# Patient Record
Sex: Female | Born: 1950 | Race: White | Hispanic: No | Marital: Married | Smoking: Never smoker
Health system: Southern US, Community
[De-identification: ages and names within clinical notes are randomized; demographics above are authoritative.]

## PROBLEM LIST (undated history)

## (undated) DIAGNOSIS — K602 Anal fissure, unspecified: Secondary | ICD-10-CM

## (undated) DIAGNOSIS — N809 Endometriosis, unspecified: Secondary | ICD-10-CM

## (undated) DIAGNOSIS — N904 Leukoplakia of vulva: Secondary | ICD-10-CM

## (undated) DIAGNOSIS — E785 Hyperlipidemia, unspecified: Secondary | ICD-10-CM

## (undated) HISTORY — DX: Hyperlipidemia, unspecified: E78.5

## (undated) HISTORY — DX: Anal fissure, unspecified: K60.2

## (undated) HISTORY — DX: Endometriosis, unspecified: N80.9

---
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
## 1996-09-05 DIAGNOSIS — K602 Anal fissure, unspecified: Secondary | ICD-10-CM

## 1996-09-05 HISTORY — DX: Anal fissure, unspecified: K60.2

## 1998-10-06 HISTORY — PX: ABDOMINAL HYSTERECTOMY: SHX81

## 1999-09-09 ENCOUNTER — Other Ambulatory Visit: Admission: RE | Admit: 1999-09-09 | Discharge: 1999-09-09 | Payer: Self-pay | Admitting: *Deleted

## 1999-09-18 ENCOUNTER — Encounter: Admission: RE | Admit: 1999-09-18 | Discharge: 1999-09-18 | Payer: Self-pay | Admitting: Gastroenterology

## 1999-09-18 ENCOUNTER — Encounter: Payer: Self-pay | Admitting: Gastroenterology

## 2000-06-10 ENCOUNTER — Encounter: Payer: Self-pay | Admitting: *Deleted

## 2000-06-10 ENCOUNTER — Ambulatory Visit (HOSPITAL_COMMUNITY): Admission: RE | Admit: 2000-06-10 | Discharge: 2000-06-10 | Payer: Self-pay | Admitting: *Deleted

## 2000-09-23 ENCOUNTER — Other Ambulatory Visit: Admission: RE | Admit: 2000-09-23 | Discharge: 2000-09-23 | Payer: Self-pay | Admitting: *Deleted

## 2000-10-26 ENCOUNTER — Inpatient Hospital Stay (HOSPITAL_COMMUNITY): Admission: AD | Admit: 2000-10-26 | Discharge: 2000-10-26 | Payer: Self-pay | Admitting: Obstetrics and Gynecology

## 2000-10-28 ENCOUNTER — Encounter: Payer: Self-pay | Admitting: Obstetrics and Gynecology

## 2000-10-28 ENCOUNTER — Ambulatory Visit (HOSPITAL_COMMUNITY): Admission: RE | Admit: 2000-10-28 | Discharge: 2000-10-28 | Payer: Self-pay | Admitting: Obstetrics and Gynecology

## 2000-11-03 ENCOUNTER — Encounter (INDEPENDENT_AMBULATORY_CARE_PROVIDER_SITE_OTHER): Payer: Self-pay

## 2000-11-03 ENCOUNTER — Inpatient Hospital Stay (HOSPITAL_COMMUNITY): Admission: EM | Admit: 2000-11-03 | Discharge: 2000-11-05 | Payer: Self-pay | Admitting: Obstetrics and Gynecology

## 2000-11-03 ENCOUNTER — Encounter: Payer: Self-pay | Admitting: Obstetrics and Gynecology

## 2003-03-01 ENCOUNTER — Encounter: Payer: Self-pay | Admitting: *Deleted

## 2003-03-01 ENCOUNTER — Encounter: Admission: RE | Admit: 2003-03-01 | Discharge: 2003-03-01 | Payer: Self-pay | Admitting: *Deleted

## 2003-09-04 ENCOUNTER — Other Ambulatory Visit: Admission: RE | Admit: 2003-09-04 | Discharge: 2003-09-04 | Payer: Self-pay | Admitting: *Deleted

## 2005-09-15 ENCOUNTER — Other Ambulatory Visit: Admission: RE | Admit: 2005-09-15 | Discharge: 2005-09-15 | Payer: Self-pay | Admitting: Obstetrics and Gynecology

## 2006-07-01 ENCOUNTER — Encounter: Admission: RE | Admit: 2006-07-01 | Discharge: 2006-07-01 | Payer: Self-pay | Admitting: *Deleted

## 2007-09-20 ENCOUNTER — Other Ambulatory Visit: Admission: RE | Admit: 2007-09-20 | Discharge: 2007-09-20 | Payer: Self-pay | Admitting: Obstetrics and Gynecology

## 2007-10-26 ENCOUNTER — Encounter: Admission: RE | Admit: 2007-10-26 | Discharge: 2007-10-26 | Payer: Self-pay | Admitting: Gastroenterology

## 2008-09-21 ENCOUNTER — Other Ambulatory Visit: Admission: RE | Admit: 2008-09-21 | Discharge: 2008-09-21 | Payer: Self-pay | Admitting: Obstetrics and Gynecology

## 2008-11-29 ENCOUNTER — Ambulatory Visit: Admission: RE | Admit: 2008-11-29 | Discharge: 2008-11-29 | Payer: Self-pay | Admitting: Gynecologic Oncology

## 2010-07-23 ENCOUNTER — Emergency Department (HOSPITAL_COMMUNITY): Admission: EM | Admit: 2010-07-23 | Discharge: 2010-07-23 | Payer: Self-pay | Admitting: Emergency Medicine

## 2010-08-20 ENCOUNTER — Encounter: Admission: RE | Admit: 2010-08-20 | Discharge: 2010-08-20 | Payer: Self-pay | Admitting: Orthopedic Surgery

## 2010-10-28 ENCOUNTER — Encounter: Payer: Self-pay | Admitting: Obstetrics & Gynecology

## 2011-02-18 NOTE — Consult Note (Signed)
Christy Rojas, Christy Rojas             ACCOUNT NO.:  [id number]   MEDICAL RECORD NO.:  [id number]          PATIENT TYPE:  OUT   LOCATION:  GYN                          FACILITY:  [facility]   PHYSICIAN:  John T. Kyla Balzarine, M.D.    DATE OF BIRTH:  1951/04/15   DATE OF CONSULTATION:  11/29/2008  DATE OF DISCHARGE:                                 CONSULTATION   CHIEF COMPLAINT:  Vaginal bleeding with likely persistent endometriosis.   HISTORY OF PRESENT ILLNESS:  This patient is being seen by me at [facility].  She underwent TAH/BSO for endometriosis in 2003.  She reported  postcoital spotting and an ultrasound revealed a small mass to the  vaginal cuff.  CA-125 value was 11.9 and Pap smear was normal.  At  referral, she had thickening at the cuff.  Of note, during surgery, she  had extensive endometriosis and during anesthetic induction developed  hypotension and tachycardia thought related to LATEX allergy.  At [facility]  last autumn she had a CT of abdomen and pelvis and repeat vaginal  ultrasound; neither revealed evidence of a mass.  Over the past several  weeks, the patient has noted vaginal bleeding and spotting on a daily  basis which ceased this weekend.  She denies new pain but continues to  have some dyspareunia.  Should be noted that she had granulation tissue  at the upper vagina biopsied on January 4 that was compatible with  granulation tissue without evidence of endometriosis or atypia.   MEDICATIONS:  Nasonex, Singulair, Advair, Zyrtec, Nexium, fish oil,  triple flexor vitamins, Centrum, Zantac, Alka-Seltzer, Celebrex, vitamin  D, red yeast rice, Zegerid and Proctosol p.r.n.   ALLERGIES:  LATEX and SEASONAL ALLERGIES.   PERSONAL AND SOCIAL HISTORY:  Married and physically active; denies  tobacco use and admits occasional ethanol.   FAMILY HISTORY:  Mother with breast cancer but no GYN or colon cancers.   REVIEW OF SYSTEMS:  Otherwise 10-point system review negative.   EXAM:  VITAL SIGNS:   Stable and afebrile with weight 168 pounds.  CONSTITUTIONAL:  The patient is anxious, alert and oriented x3 in no  acute distress.  PELVIC:  External genitalia are clear.  Speculum examination reveals  normal BUS, bladder and urethra and vagina with the exception of a 1 mm  tag of granulation tissue at the vaginal apex.  This is cauterized with  silver nitrate.  On bimanual and rectovaginal examinations, a 3 x 4 cm  relatively flat area of minimally tender induration is present adjacent  to the left vaginal fornix.   ASSESSMENT:  Postsurgical pelvic scarring, suspect residual  endometriosis.   PLAN:  Options for immediate surgery and antiestrogen therapy were again  discussed with the patient.  She is not enthusiastic about pursuing  either of these options at present.  She has her followup appointment to  see me in  approximately 3 months.  If she has persistent bleeding or increasing  pain, surgery would obviously be recommended.  Because of her extensive  adhesions previously and the location of this lesion, I think it would  be best  to approach her through an open technique.      John T. Kyla Balzarine, M.D.  Electronically Signed     JTS/MEDQ  D:  11/29/2008  T:  11/29/2008  Job:  [id number]   cc:   Telford Nab, R.N.  [address]  [place], [place] [address]   M. Leda Quail, MD  Fax: [phone]

## 2011-02-21 NOTE — Discharge Summary (Signed)
[facility]  Patient:    Christy Rojas, Christy Rojas                       MRN: [id number] Adm. Date:  [id number] Disc. Date: [id number] Attending:  Jenean Lindau                           Discharge Summary  PRINCIPAL DIAGNOSIS ON DISCHARGE:  Severe endometriosis with left endometrioma and severe pelvic adhesions.  PROCEDURE:  Diagnostic laparoscopy followed by total abdominal hysterectomy with bilateral salpingo-oophorectomy, lysis of adhesions, left ureterolysis.  INTRAOPERATIVE COMPLICATIONS:  Patient went into anaphylactic shock intraoperatively which was felt possibly due to a previously unknown latex sensitivity versus Ancef allergy versus a dose of Nimbex which was a neuromuscular relaxer given just prior to her reaction.  HOSPITAL CONSULTATIONS:  None.  CONDITION ON DISCHARGE:  Improved.  HISTORY OF PRESENT ILLNESS:  Atonya Templer Box is a 60 year old with a large complex adnexal mass on the left who was having worsening pain from this cyst. She was felt to possibly have torsion of the cyst with or without endometriosis.  She was admitted for same-day surgery to start with a laparoscopic left salpingo-oophorectomy with right tubal cautery versus laparotomy with TAH-BSO and additional procedures as indicated.  She desired permanent sterilization in any event.  HOSPITAL COURSE:  She was admitted for same day surgery with no reported allergies or latex sensitivities.  She had undergone a mechanical bowel prep preoperatively.  She was taken to the operating room where, after receiving Ancef preoperative antibiotic prophylaxis she was placed under general endotracheal anesthesia and, first, a laparoscopy followed by a laparotomy was performed.  During the course of the dissection of the left adnexa, the surgeons were notified by the nurse anesthetist that the patients blood pressure was low.  The retractor was repositioned and lap packs were repositioned,  and this did not help the situation.  She subsequently became tachycardic and Dr. Rica Mast was immediately contacted.  He immediately made the diagnosis of an anaphylactic-type reaction as the patient was experiencing wheezing in addition to her unstable vital signs.  At this point, it was not clear what the allergic reaction was to.  It was felt it could be due to the Nimbex which is a muscle relaxer which had just been given versus the Ancef versus latex sensitivity although the patient had never reported this.  In any event, as the patient was being stabilized from the anesthesia standpoint, her entire drape and surgical suite was converted into a latex-free environment. The patient was stabilized by Dr. Rica Mast and he allowed the surgeon to proceed with surgery.  She had extensive lysis of adhesions with removal of the left tube and ovary with a large endometrioma.  As per previous discussion with her, the remainder of the hysterectomy was accomplished particularly since Dr. Rica Mast reported that she was stable and it was appropriate to proceed with definitive surgery.  The surgery was completed without further incident and the patient remained stable throughout.  Estimated blood loss was 300 cc and her hemoglobin was stable.  Postoperatively, she did quite well. She maintained excellent urinary output, stable vital signs, and stable SAO2s on nasal cannula oxygen.  She was placed on latex precautions.  The patient was discussed with Dr. Sandrea Hughs, pulmonology, who felt that she did not need to be seen since she was clinically stable but would see her if necessary.  She was watched in the intensive care unit overnight to make sure she remains stable.  On postoperative day #1, she was tolerating p.o. liquids and medications.  She had remained stable overnight.  She had return of bowel sounds, clear lungs, and stable vital signs.  Her hemoglobin was 11.2 with hematocrit of 33.1, and a  normal potassium at 4.1.  She was transferred to the floor on postoperative day #1.  She continued to make rapid progress with the passage of flatus and tolerance of a general diet on postoperative day #2.  Steri-Strips were not used as they have latex in them.  Therefore, her staples were left in and will be removed in the office when she follows up in one week.  Her pathology returned revealing benign ovarian endometriotic cyst and ovarian endometriosis, benign paratubal cyst and tubal endometriosis of the left per frozen section which was confirmed on final.  Uterus revealed benign proliferative endometrium with no obvious abnormalities other than uterine serosal endometriosis, right fallopian tube and ovaries with no focal abnormality noted.  The patient reported some itching the night of postoperative day #1 but no rash or wheezing, and this resolved spontaneously.  She had no other allergic or anaphylactic signs or symptoms throughout her hospital course.  She was told to observe latex precautions.  She is to take Advil, or Aleve, or Vioxx for pain.  She has prescriptions for Vioxx and Phenergan at home.  She was given a prescription for Ambien 10 mg, dispensed #10, one p.o. q.h.s. p.r.n. sleep with one refill, Percocet 5/500, #30, one to two q.4-6h. p.r.n. pain with no refills, and estradiol 1 mg, dispensed #31, one p.o. q.d. with 11 refills.  Dr. Laurette Schimke has been contacted and will see the patient in follow-up to evaluate her for the source of her reaction.  It has been requested that he send copies of his reports to this physician as well as to Dr. Rica Mast of the department of anesthesiology.  The patient was discharged home on postoperative day #2 in improved condition. She was given routine written and verbal instructions and the prescriptions as noted above.  She is to call the office for excessive pain, fever, bleeding, symptoms of DVT, PE, urinary infection, incisional  infection, allergic-type symptoms, or any other concerns. DD:  01/19/01 TD:  01/19/01 Job: [id number]
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
## 2011-08-01 ENCOUNTER — Other Ambulatory Visit (HOSPITAL_COMMUNITY): Payer: Self-pay | Admitting: Diagnostic Neuroimaging

## 2011-08-01 DIAGNOSIS — R209 Unspecified disturbances of skin sensation: Secondary | ICD-10-CM

## 2011-08-01 DIAGNOSIS — R200 Anesthesia of skin: Secondary | ICD-10-CM

## 2011-08-05 ENCOUNTER — Ambulatory Visit (HOSPITAL_COMMUNITY): Payer: BC Managed Care – PPO | Attending: Diagnostic Neuroimaging

## 2011-08-05 DIAGNOSIS — R51 Headache: Secondary | ICD-10-CM | POA: Insufficient documentation

## 2011-08-05 DIAGNOSIS — R209 Unspecified disturbances of skin sensation: Secondary | ICD-10-CM | POA: Insufficient documentation

## 2011-08-05 DIAGNOSIS — R200 Anesthesia of skin: Secondary | ICD-10-CM

## 2011-08-05 DIAGNOSIS — I059 Rheumatic mitral valve disease, unspecified: Secondary | ICD-10-CM

## 2011-08-06 ENCOUNTER — Encounter (HOSPITAL_COMMUNITY): Payer: Self-pay | Admitting: Diagnostic Neuroimaging

## 2011-08-06 ENCOUNTER — Encounter: Payer: Self-pay | Admitting: *Deleted

## 2011-08-22 ENCOUNTER — Emergency Department (HOSPITAL_COMMUNITY): Payer: No Typology Code available for payment source

## 2011-08-22 ENCOUNTER — Encounter: Payer: Self-pay | Admitting: Emergency Medicine

## 2011-08-22 ENCOUNTER — Emergency Department (HOSPITAL_COMMUNITY)
Admission: EM | Admit: 2011-08-22 | Discharge: 2011-08-22 | Disposition: A | Discharge status: Home / Self Care | Payer: No Typology Code available for payment source | Attending: Emergency Medicine | Admitting: Emergency Medicine

## 2011-08-22 DIAGNOSIS — S161XXA Strain of muscle, fascia and tendon at neck level, initial encounter: Secondary | ICD-10-CM

## 2011-08-22 DIAGNOSIS — S139XXA Sprain of joints and ligaments of unspecified parts of neck, initial encounter: Secondary | ICD-10-CM | POA: Insufficient documentation

## 2011-08-22 DIAGNOSIS — M25541 Pain in joints of right hand: Secondary | ICD-10-CM

## 2011-08-22 DIAGNOSIS — M542 Cervicalgia: Secondary | ICD-10-CM | POA: Insufficient documentation

## 2011-08-22 DIAGNOSIS — J45909 Unspecified asthma, uncomplicated: Secondary | ICD-10-CM | POA: Insufficient documentation

## 2011-08-22 DIAGNOSIS — M25549 Pain in joints of unspecified hand: Secondary | ICD-10-CM | POA: Insufficient documentation

## 2011-08-22 MED ORDER — IBUPROFEN 800 MG PO TABS
800.0000 mg | ORAL_TABLET | Freq: Once | ORAL | Status: AC
  Administered 2011-08-22: 800 mg via ORAL
  Filled 2011-08-22: qty 1

## 2011-08-22 MED ORDER — OXYCODONE-ACETAMINOPHEN 5-325 MG PO TABS: 1.0000 | ORAL_TABLET | Freq: Four times a day (QID) | ORAL | Status: AC | PRN

## 2011-08-22 MED ORDER — OXYCODONE-ACETAMINOPHEN 5-325 MG PO TABS
1.0000 | ORAL_TABLET | Freq: Once | ORAL | Status: DC
  Filled 2011-08-22: qty 1
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
## 2013-02-10 ENCOUNTER — Other Ambulatory Visit: Payer: Self-pay | Admitting: Certified Nurse Midwife

## 2013-02-10 NOTE — Telephone Encounter (Signed)
Pt was given this Rx for vitamin D on 10/21/2012 x1 year. Please advise. Chart in your office.

## 2013-02-11 NOTE — Telephone Encounter (Signed)
agree

## 2013-02-14 ENCOUNTER — Encounter: Payer: Self-pay | Admitting: *Deleted

## 2013-02-15 ENCOUNTER — Telehealth: Payer: Self-pay | Admitting: Nurse Practitioner

## 2013-02-15 ENCOUNTER — Encounter: Payer: Self-pay | Admitting: *Deleted

## 2013-02-15 ENCOUNTER — Ambulatory Visit (INDEPENDENT_AMBULATORY_CARE_PROVIDER_SITE_OTHER): Payer: BC Managed Care – PPO | Admitting: Nurse Practitioner

## 2013-02-15 ENCOUNTER — Encounter: Payer: Self-pay | Admitting: Nurse Practitioner

## 2013-02-15 VITALS — BP 124/70 | HR 74 | Resp 12 | Wt 174.4 lb

## 2013-02-15 DIAGNOSIS — B373 Candidiasis of vulva and vagina: Secondary | ICD-10-CM

## 2013-02-15 LAB — POCT WET PREP (WET MOUNT)

## 2013-02-15 MED ORDER — FLUCONAZOLE 150 MG PO TABS: 150.0000 mg | ORAL_TABLET | Freq: Once | ORAL | Status: DC

## 2013-02-15 NOTE — Patient Instructions (Signed)
Monilial Vaginitis Vaginitis in a soreness, swelling and redness (inflammation) of the vagina and vulva. Monilial vaginitis is not a sexually transmitted infection. CAUSES  Yeast vaginitis is caused by yeast (candida) that is normally found in your vagina. With a yeast infection, the candida has overgrown in number to a point that upsets the chemical balance. SYMPTOMS   White, thick vaginal discharge.  Swelling, itching, redness and irritation of the vagina and possibly the lips of the vagina (vulva).  Burning or painful urination.  Painful intercourse. DIAGNOSIS  Things that may contribute to monilial vaginitis are:  Postmenopausal and virginal states.  Pregnancy.  Infections.  Being tired, sick or stressed, especially if you had monilial vaginitis in the past.  Diabetes. Good control will help lower the chance.  Birth control pills.  Tight fitting garments.  Using bubble bath, feminine sprays, douches or deodorant tampons.  Taking certain medications that kill germs (antibiotics).  Sporadic recurrence can occur if you become ill. TREATMENT  Your caregiver will give you medication.  There are several kinds of anti monilial vaginal creams and suppositories specific for monilial vaginitis. For recurrent yeast infections, use a suppository or cream in the vagina 2 times a week, or as directed.  Anti-monilial or steroid cream for the itching or irritation of the vulva may also be used. Get your caregiver's permission.  Painting the vagina with methylene blue solution may help if the monilial cream does not work.  Eating yogurt may help prevent monilial vaginitis. HOME CARE INSTRUCTIONS   Finish all medication as prescribed.  Do not have sex until treatment is completed or after your caregiver tells you it is okay.  Take warm sitz baths.  Do not douche.  Do not use tampons, especially scented ones.  Wear cotton underwear.  Avoid tight pants and panty  hose.  Tell your sexual partner that you have a yeast infection. They should go to their caregiver if they have symptoms such as mild rash or itching.  Your sexual partner should be treated as well if your infection is difficult to eliminate.  Practice safer sex. Use condoms.  Some vaginal medications cause latex condoms to fail. Vaginal medications that harm condoms are:  Cleocin cream.  Butoconazole (Femstat).  Terconazole (Terazol) vaginal suppository.  Miconazole (Monistat) (may be purchased over the counter). SEEK MEDICAL CARE IF:   You have a temperature by mouth above 102 F (38.9 C).  The infection is getting worse after 2 days of treatment.  The infection is not getting better after 3 days of treatment.  You develop blisters in or around your vagina.  You develop vaginal bleeding, and it is not your menstrual period.  You have pain when you urinate.  You develop intestinal problems.  You have pain with sexual intercourse. Document Released: 07/02/2005 Document Revised: 12/15/2011 Document Reviewed: 03/16/2009 ExitCare Patient Information 2013 [employer].  

## 2013-02-15 NOTE — Telephone Encounter (Signed)
Pt states she lost her Rx for vitamin d from 10/2012. Chart and message relayed to front desk staff.

## 2013-02-15 NOTE — Progress Notes (Signed)
Subjective:     Patient ID: Christy Rojas, female   DOB: 12-12-1950, 62 y.o.   MRN: [id number]  Vaginal Pain The patient's primary symptoms include a vaginal discharge. The patient's pertinent negatives include no pelvic pain. This is a new problem. The current episode started in the past 7 days. The problem occurs 2 to 4 times per day. The problem has been waxing and waning. Associated symptoms include painful intercourse. Pertinent negatives include no abdominal pain, diarrhea, discolored urine, fever, flank pain, frequency or urgency. The vaginal discharge was scant and white (External irritaton more than internal irritation.). She has not been passing clots. Exacerbated by: with playing [place] and [place]. She has tried nothing for the symptoms. She is sexually active. She uses hysterectomy for contraception. She is postmenopausal. (History of LSA and tried steroid cream without help.)     Review of Systems  Constitutional: Negative.  Negative for fever.  Respiratory: Negative.   Cardiovascular: Negative.   Gastrointestinal: Negative.  Negative for abdominal pain and diarrhea.  Genitourinary: Positive for vaginal discharge and vaginal pain. Negative for urgency, frequency, flank pain, decreased urine volume and pelvic pain.  Skin: Negative.   Neurological: Negative.   Psychiatric/Behavioral: Negative.        Objective:   Physical Exam  Constitutional: She is oriented to person, place, and time. She appears well-developed and well-nourished.  Cardiovascular: Normal rate and regular rhythm.   Pulmonary/Chest: Effort normal.  Genitourinary: Vaginal discharge found.  Thi white to yellow discharge. External irritation with linear cuts consistent with chronic yeast. No flare of LSA  Neurological: She is alert and oriented to person, place, and time.  Skin: Skin is warm and dry.  Psychiatric: She has a normal mood and affect. Her behavior is normal. Judgment and thought content  normal.       Assessment:     Yeast vulvovaginits History of LSA Atrophic vaginitis uses E.V.V.O. Prn Contraindications with vaginal ERT secondary to [facility] of cancer mother age 24    Plan:     Diflucan 150 mg today and repeat in 3 days.  They are going for a week vacation to [place] this weekend.

## 2013-02-15 NOTE — Telephone Encounter (Signed)
[facility] in [place]. Patient calling re: needs vit d refill authorized please. Patient states it was denied but she is still taking it. Please advise.

## 2013-02-16 NOTE — Telephone Encounter (Signed)
Called pt to notify of lost Rx fee. Pt says she will get over the counter Vit D for now.

## 2013-02-16 NOTE — Progress Notes (Signed)
Encounter reviewed by Dr. Tashina Credit Silva.  

## 2013-03-07 ENCOUNTER — Ambulatory Visit (INDEPENDENT_AMBULATORY_CARE_PROVIDER_SITE_OTHER): Payer: BC Managed Care – PPO | Admitting: Nurse Practitioner

## 2013-03-07 ENCOUNTER — Encounter: Payer: Self-pay | Admitting: Nurse Practitioner

## 2013-03-07 VITALS — BP 118/70 | HR 72 | Resp 16 | Wt 174.6 lb

## 2013-03-07 DIAGNOSIS — B379 Candidiasis, unspecified: Secondary | ICD-10-CM

## 2013-03-07 MED ORDER — FLUCONAZOLE 150 MG PO TABS: 150.0000 mg | ORAL_TABLET | Freq: Once | ORAL | Status: DC

## 2013-03-07 NOTE — Patient Instructions (Addendum)
Use Diflucan 150 mg today and repeat in 3 days.  Keep the other refill for as needed.  A & D with Zinc

## 2013-03-07 NOTE — Progress Notes (Signed)
Subjective:     Patient ID: Christy Rojas, female   DOB: 08-01-1951, 62 y.o.   MRN: [id number]  HPI Patient has as history of yeast vaginitis for which she ws treated with Diflucan.  Symptoms abated but then went to the beach with family and again symptoms flared. Tried OTC Monistat with increasing itching and burning.  External irritation persist. Used OTC A & D and Desitin without help. No other changes in personal products. Denies urinary symptoms and abdominal pain.   Review of Systems  Gastrointestinal: Negative for nausea, vomiting, abdominal pain and diarrhea.  Genitourinary: Positive for vaginal discharge. Negative for dysuria, urgency, frequency, hematuria, flank pain, vaginal bleeding, difficulty urinating and pelvic pain.  Skin: Negative.   Neurological: Negative.   Psychiatric/Behavioral: Negative.        Objective:   Physical Exam  Constitutional: She is oriented to person, place, and time. She appears well-developed and well-nourished.  Abdominal: Soft. She exhibits no distension and no mass. There is no tenderness. There is no rebound and no guarding.  Genitourinary:  External labia with linear cuts consistent with yeast.  White thick vaginal discharge. KOH prep + yeast, Saline negative for clue, Ph 3.5  Neurological: She is alert and oriented to person, place, and time.  Psychiatric: She has a normal mood and affect. Her behavior is normal. Judgment and thought content normal.       Assessment:     Yeast Vulvovaginitis    Plan:    Diflucan  150 MG # 2 Avoid Vagisil and Monistat since they may have aggravated symptoms. She may use A & D ointment with Zinc prn.

## 2013-03-09 NOTE — Progress Notes (Deleted)
You need to also document what you saw with KOH--which I think was yeast but you put wet prep + yeast.  Should put Wet prep:  KOH + Yeast, saline negative for clue, and ph.

## 2013-03-10 NOTE — Progress Notes (Signed)
Reviewed personally.  M. Suzanne Jaquesha Boroff, MD.  

## 2013-05-17 ENCOUNTER — Ambulatory Visit (INDEPENDENT_AMBULATORY_CARE_PROVIDER_SITE_OTHER): Payer: BC Managed Care – PPO | Admitting: Nurse Practitioner

## 2013-05-17 ENCOUNTER — Encounter: Payer: Self-pay | Admitting: Nurse Practitioner

## 2013-05-17 ENCOUNTER — Telehealth: Payer: Self-pay | Admitting: Nurse Practitioner

## 2013-05-17 VITALS — BP 118/58 | HR 74 | Resp 14 | Ht 61.0 in | Wt 171.6 lb

## 2013-05-17 DIAGNOSIS — B373 Candidiasis of vulva and vagina: Secondary | ICD-10-CM

## 2013-05-17 DIAGNOSIS — B3731 Acute candidiasis of vulva and vagina: Secondary | ICD-10-CM

## 2013-05-17 MED ORDER — FLUCONAZOLE 150 MG PO TABS
150.0000 mg | ORAL_TABLET | Freq: Once | ORAL | Status: DC
Start: 2013-05-17 — End: 2013-05-24

## 2013-05-17 NOTE — Progress Notes (Signed)
Subjective:     Patient ID: Christy Rojas, female   DOB: 1951-08-12, 62 y.o.   MRN: [id number]  HPI  62 yo WM Fe presents again with a recurrent yeast infection.  She believes this one is related to use of tub baths to help with her low back pain.  She has external irritation and white vaginal discharge. Denies any urinary symptoms.  She is now using extra virgin olive oil and has improvement in vaginal dryness. She is concerned about chronic yeast and maybe underlying DM.   Review of Systems  Constitutional: Negative for fever and chills.  Respiratory: Negative.   Cardiovascular: Negative.   Gastrointestinal: Negative.   Genitourinary: Positive for vaginal discharge. Negative for dysuria, urgency, frequency, flank pain, vaginal bleeding, vaginal pain and pelvic pain.  Musculoskeletal: Positive for back pain and arthralgias.  Skin: Negative.   Neurological: Negative.   Psychiatric/Behavioral: Negative.        Objective:   Physical Exam  Constitutional: She appears well-developed and well-nourished.  Pulmonary/Chest: Effort normal.  Abdominal: Soft. She exhibits no distension. There is no tenderness. There is no rebound and no guarding.  Genitourinary:  White thin vaginal discharge. No other lesions.  She does have some improvement in vaginal dryness. No pain at bladder. Wet prep: KOH + yeast, NSS negative, PH 5.0       Assessment:     Yeast vaginitis Atrophic vaginitis improved R/O DM as cause of recurrent yeast    Plan:     Diflucan 150 mg X 2 Will get HGB AIC and follow

## 2013-05-17 NOTE — Patient Instructions (Addendum)
We will call you with lab results.       

## 2013-05-18 LAB — HEMOGLOBIN A1C: Hgb A1c MFr Bld: 6 % — ABNORMAL HIGH (ref <5.7)

## 2013-05-19 ENCOUNTER — Telehealth: Payer: Self-pay | Admitting: *Deleted

## 2013-05-19 NOTE — Telephone Encounter (Signed)
LVM to return my call in regards to lab results.  

## 2013-05-19 NOTE — Telephone Encounter (Signed)
Message copied by Osie Bond on Thu May 19, 2013  9:05 AM ------      Message from: Ria Comment R      Created: Thu May 19, 2013  8:12 AM       Let patient know that her HGB AIC is elevated at 6.0 and will need to see PCP. ------

## 2013-05-19 NOTE — Progress Notes (Signed)
Encounter reviewed by Dr. Brook Silva.  

## 2013-05-20 NOTE — Telephone Encounter (Signed)
Patient returning Tiffany's call.

## 2013-05-23 ENCOUNTER — Telehealth: Payer: Self-pay | Admitting: *Deleted

## 2013-05-23 NOTE — Telephone Encounter (Signed)
Patient needs her results

## 2013-05-23 NOTE — Telephone Encounter (Signed)
Pt is aware of lab results. Pt states yeast symptoms are back and would like something called in. I advised pt she may have to come in, however pt wanted Patty Grubb's advise first.

## 2013-05-23 NOTE — Telephone Encounter (Signed)
Message copied by Osie Bond on Mon May 23, 2013  4:41 PM ------      Message from: Ria Comment R      Created: Thu May 19, 2013  8:12 AM       Let patient know that her HGB AIC is elevated at 6.0 and will need to see PCP. ------

## 2013-05-24 ENCOUNTER — Other Ambulatory Visit: Payer: Self-pay | Admitting: *Deleted

## 2013-05-24 MED ORDER — FLUCONAZOLE 150 MG PO TABS: 150.0000 mg | ORAL_TABLET | Freq: Once | ORAL | Status: DC

## 2013-05-24 NOTE — Telephone Encounter (Signed)
Diflucan 150 mg #2/0 refills sent to pt's pharmacy, per PG. pt is aware.

## 2013-05-24 NOTE — Telephone Encounter (Signed)
Ok to refill Diflucan 150 mg X 2 tabs.

## 2013-08-01 ENCOUNTER — Encounter (INDEPENDENT_AMBULATORY_CARE_PROVIDER_SITE_OTHER): Payer: Self-pay

## 2013-08-01 ENCOUNTER — Encounter: Payer: Self-pay | Admitting: Cardiology

## 2013-08-01 ENCOUNTER — Ambulatory Visit (INDEPENDENT_AMBULATORY_CARE_PROVIDER_SITE_OTHER): Payer: BC Managed Care – PPO | Admitting: Cardiology

## 2013-08-01 VITALS — BP 112/78 | HR 70 | Ht 61.0 in | Wt 171.0 lb

## 2013-08-01 DIAGNOSIS — R079 Chest pain, unspecified: Secondary | ICD-10-CM | POA: Insufficient documentation

## 2013-08-01 NOTE — Patient Instructions (Signed)
Your physician has requested that you have an exercise tolerance test. For further information please visit [email or web address]. Please also follow instruction sheet, as given.  Your physician recommends that you continue on your current medications as directed. Please refer to the Current Medication list given to you today.  

## 2013-08-01 NOTE — Progress Notes (Signed)
[address], [address] [place], [place]  [address] Phone: [phone] Fax:  [phone]  Date:  08/01/2013   ID:  Christy, Rojas June 29, 1951, MRN [id number]  PCP:  Hollice Espy, MD   History of Present Illness: Christy Rojas is a 62 y.o. female  chest tightness recently during exercise. Location is epigastric and across lower sternum. Sometimes occurs during exercise. Discomfort can occur mostly after exercise at rest. Plays tennis a few times a week and no issues. Her husband, my patient, does feel at times that she may look stressed.  Hyperlipidemia here for evaluation. Hemoglobin A1c is 6.0, glucose 108, total cholesterol 208, HDL 52, triglycerides 97, LDL 137.   Wt Readings from Last 3 Encounters:  08/01/13 171 lb (77.565 kg)  05/17/13 171 lb 9.6 oz (77.837 kg)  03/07/13 174 lb 9.6 oz (79.198 kg)     Past Medical History  Diagnosis Date  . Asthma   . Rectal fissure 12/97  . Endometriosis     resolved by TAH    Past Surgical History  Procedure Laterality Date  . Abdominal hysterectomy    . Tonsillectomy and adenoidectomy  1961    Current Outpatient Prescriptions  Medication Sig Dispense Refill  . ALPRAZolam (XANAX) 0.5 MG tablet Take 0.5 mg by mouth at bedtime as needed. To help sleep       . celecoxib (CELEBREX) 200 MG capsule Take 200 mg by mouth as needed for pain.      . Cyanocobalamin (VITAMIN B-12 PO) Take 1 tablet by mouth daily.        . cyclobenzaprine (FLEXERIL) 10 MG tablet Take 10 mg by mouth daily as needed. For pain       . esomeprazole (NEXIUM) 40 MG capsule Take 40 mg by mouth daily before breakfast. 1/2 tab      . fluconazole (DIFLUCAN) 150 MG tablet Take 1 tablet (150 mg total) by mouth once. Take one tablet.  Repeat in 48 hours if symptoms are not completely resolved.  2 tablet  0  . fluticasone-salmeterol (ADVAIR HFA) 230-21 MCG/ACT inhaler Inhale 2 puffs into the lungs daily as needed. For shortness of breath      .  ibuprofen (ADVIL,MOTRIN) 200 MG tablet 200 mg every 6 (six) hours as needed for pain.      Marland Kitchen lactobacillus acidophilus (BACID) TABS tablet Take 2 tablets by mouth 3 (three) times daily.      Marland Kitchen loratadine (CLARITIN) 10 MG tablet Take 10 mg by mouth daily.      . mometasone (NASONEX) 50 MCG/ACT nasal spray Place 2 sprays into the nose as needed.      . montelukast (SINGULAIR) 10 MG tablet Take 10 mg by mouth at bedtime.        . Multiple Vitamins-Minerals (MULTIVITAMINS THER. W/MINERALS) TABS Take 1 tablet by mouth daily.        . Nutritional Supplements (ESTROVEN PO) Take by mouth as directed. One tab at bed      . Omega-3 Fatty Acids (FISH OIL PO) Take 1 capsule by mouth daily.        . Plant Sterols and Stanols (CHOLEST OFF PO) Take 1 tablet by mouth daily.         No current facility-administered medications for this visit.    Allergies:    Allergies  Allergen Reactions  . Latex Other (See Comments)    Patient almost died.  . Other Other (See Comments)  Patient states that there is an allergy to something she received in anesthesia that begins with  (mes). They are pretty sure it was the latex but considered the other one to be an allergy as well since it was a high severity.    Social History:  The patient  reports that she has never smoked. She has never used smokeless tobacco. She reports that she drinks about 3.0 ounces of alcohol per week. She reports that she does not use illicit drugs.   Family History  Problem Relation Age of Onset  . Breast cancer Mother 95    ROS:  Please see the history of present illness.   No fevers, no chills, no orthopnea, no PND, no rash, no strokelike symptoms.   All other systems reviewed and negative.   PHYSICAL EXAM: VS:  BP 112/78  Pulse 70  Ht 5\' 1"  (1.549 m)  Wt 171 lb (77.565 kg)  BMI 32.33 kg/m2 Well nourished, well developed, in no acute distress HEENT: normal, [place]/AT, EOMI Neck: no JVD, normal carotid upstroke, no bruit Cardiac:   normal S1, S2; RRR; no murmur Lungs:  clear to auscultation bilaterally, no wheezing, rhonchi or rales Abd: soft, nontender, no hepatomegaly, no bruits Ext: no edema, 2+ distal pulses Skin: warm and dry GU: deferred Neuro: no focal abnormalities noted, AAO x 3  EKG:  Sinus rhythm rate 68 with no other abnormalities     ASSESSMENT AND PLAN:  1. Atypical chest pain-likely musculoskeletal discomfort based upon her recent physical activity and exercise. During [place], doing our movements that could cause this discomfort. She is quite active, playing tennis and has not had any episodes of what she would consider as exertional angina. Nonetheless, we will check an exercise treadmill test to ensure that there is no evidence of ischemia. 2. Obesity-encourage weight loss. She is still quite active.  Signed, Donato Schultz, MD [facility]  08/01/2013 3:39 PM

## 2013-08-10 ENCOUNTER — Telehealth: Payer: Self-pay | Admitting: Cardiology

## 2013-08-10 NOTE — Telephone Encounter (Signed)
Records Rec Via Mail On pt For Dr.Skains From [facility] gave to Scheduling Dept 08/10/13/KM

## 2013-08-29 ENCOUNTER — Ambulatory Visit: Payer: BC Managed Care – PPO | Admitting: Nurse Practitioner

## 2013-08-30 ENCOUNTER — Encounter: Payer: Self-pay | Admitting: Nurse Practitioner

## 2013-08-30 ENCOUNTER — Ambulatory Visit (INDEPENDENT_AMBULATORY_CARE_PROVIDER_SITE_OTHER): Payer: BC Managed Care – PPO | Admitting: Nurse Practitioner

## 2013-08-30 VITALS — BP 110/60 | HR 76 | Ht 61.0 in | Wt 172.0 lb

## 2013-08-30 DIAGNOSIS — N898 Other specified noninflammatory disorders of vagina: Secondary | ICD-10-CM

## 2013-08-30 DIAGNOSIS — N939 Abnormal uterine and vaginal bleeding, unspecified: Secondary | ICD-10-CM

## 2013-08-30 DIAGNOSIS — B373 Candidiasis of vulva and vagina: Secondary | ICD-10-CM

## 2013-08-30 MED ORDER — ZOLPIDEM TARTRATE 10 MG PO TABS: 10.0000 mg | ORAL_TABLET | Freq: Every evening | ORAL | Status: DC | PRN

## 2013-08-30 MED ORDER — TERCONAZOLE 0.4 % VA CREA: 1.0000 | TOPICAL_CREAM | Freq: Every day | VAGINAL | Status: DC

## 2013-08-30 NOTE — Progress Notes (Signed)
Reviewed personally.  M. Suzanne Karrina Lye, MD.  

## 2013-08-30 NOTE — Patient Instructions (Signed)
Monilial Vaginitis  Vaginitis in a soreness, swelling and redness (inflammation) of the vagina and vulva. Monilial vaginitis is not a sexually transmitted infection.  CAUSES   Yeast vaginitis is caused by yeast (candida) that is normally found in your vagina. With a yeast infection, the candida has overgrown in number to a point that upsets the chemical balance.  SYMPTOMS   · White, thick vaginal discharge.  · Swelling, itching, redness and irritation of the vagina and possibly the lips of the vagina (vulva).  · Burning or painful urination.  · Painful intercourse.  DIAGNOSIS   Things that may contribute to monilial vaginitis are:  · Postmenopausal and virginal states.  · Pregnancy.  · Infections.  · Being tired, sick or stressed, especially if you had monilial vaginitis in the past.  · Diabetes. Good control will help lower the chance.  · Birth control pills.  · Tight fitting garments.  · Using bubble bath, feminine sprays, douches or deodorant tampons.  · Taking certain medications that kill germs (antibiotics).  · Sporadic recurrence can occur if you become ill.  TREATMENT   Your caregiver will give you medication.  · There are several kinds of anti monilial vaginal creams and suppositories specific for monilial vaginitis. For recurrent yeast infections, use a suppository or cream in the vagina 2 times a week, or as directed.  · Anti-monilial or steroid cream for the itching or irritation of the vulva may also be used. Get your caregiver's permission.  · Painting the vagina with methylene blue solution may help if the monilial cream does not work.  · Eating yogurt may help prevent monilial vaginitis.  HOME CARE INSTRUCTIONS   · Finish all medication as prescribed.  · Do not have sex until treatment is completed or after your caregiver tells you it is okay.  · Take warm sitz baths.  · Do not douche.  · Do not use tampons, especially scented ones.  · Wear cotton underwear.  · Avoid tight pants and panty  hose.  · Tell your sexual partner that you have a yeast infection. They should go to their caregiver if they have symptoms such as mild rash or itching.  · Your sexual partner should be treated as well if your infection is difficult to eliminate.  · Practice safer sex. Use condoms.  · Some vaginal medications cause latex condoms to fail. Vaginal medications that harm condoms are:  · Cleocin cream.  · Butoconazole (Femstat®).  · Terconazole (Terazol®) vaginal suppository.  · Miconazole (Monistat®) (may be purchased over the counter).  SEEK MEDICAL CARE IF:   · You have a temperature by mouth above 102° F (38.9° C).  · The infection is getting worse after 2 days of treatment.  · The infection is not getting better after 3 days of treatment.  · You develop blisters in or around your vagina.  · You develop vaginal bleeding, and it is not your menstrual period.  · You have pain when you urinate.  · You develop intestinal problems.  · You have pain with sexual intercourse.  Document Released: 07/02/2005 Document Revised: 12/15/2011 Document Reviewed: 03/16/2009  ExitCare® Patient Information ©2014 [employer].

## 2013-08-30 NOTE — Progress Notes (Signed)
Subjective:     Patient ID: Christy Rojas, female   DOB: 1950-12-21, 62 y.o.   MRN: [id number]  HPI This 62 yo WM Fe complains of vaginal discharge.  Feels this is yeast again with itching and white vaginal discharge.  Most recently used EVOO and noted a pink blood stain on her underwear.    Not SA in over 6 months. She did see PCP and had HGB AIC and she was borderline DM.  She is going to work on diet and exercise once she is back from her trip this Saturday.  She reports the last time she took Diflucan had a rash on her face - not sure if related.   She had a stitch removed by Dr. Kyla Balzarine in 2010 following her hysterectomy in 2002.  She believes another stitch is back again causing this bleeding. She also has tendonitis of her foot and is using a walking boot. Unsure if this is helping or making things worse.  Review of Systems  Constitutional: Negative for fever, chills and fatigue.  HENT: Negative.   Respiratory: Negative.   Cardiovascular: Negative.   Gastrointestinal: Negative.  Negative for nausea, vomiting, abdominal pain, diarrhea and anal bleeding.  Genitourinary: Positive for vaginal discharge and vaginal pain. Negative for dysuria, frequency, hematuria, flank pain, pelvic pain and dyspareunia.  Musculoskeletal: Positive for joint swelling.  Skin: Negative.   Neurological: Negative.   Psychiatric/Behavioral: Negative.        Objective:   Physical Exam  Constitutional: She appears well-developed and well-nourished. No distress.  Abdominal: Soft. She exhibits no distension. There is no tenderness. There is no rebound and no guarding.  Genitourinary:  Externally has several areas of LSA with flare.  White thick vaginal discharge.  At posterior vaginal vault on the left there is a spot of pink discharge. At touching and trying to feel if anything is protruding she is very tender.  Wet Prep:  PH 5.; NSS: no clue cells but + RBC; KOH: + yeast.       Assessment:     Yeast  Vaginitis - acute and recurrent   Removal of Stitch in 2010 from previous hysterectomy scar LSA with flare   PP    Plan Terazol vaginal cream HS X 7 Refill on Ambien # 10 only to use for her trip upcoming this Saturday. She may also use the Clobetasol for LSA externally She will return in 2-3 weeks and see Dr. Hyacinth Meeker - may need a repeat PUS to evaluate this area of bleeding.

## 2013-09-12 ENCOUNTER — Ambulatory Visit (INDEPENDENT_AMBULATORY_CARE_PROVIDER_SITE_OTHER): Payer: BC Managed Care – PPO | Admitting: Nurse Practitioner

## 2013-09-12 ENCOUNTER — Encounter: Payer: BC Managed Care – PPO | Admitting: Nurse Practitioner

## 2013-09-12 ENCOUNTER — Encounter (INDEPENDENT_AMBULATORY_CARE_PROVIDER_SITE_OTHER): Payer: Self-pay

## 2013-09-12 VITALS — BP 114/77 | HR 71

## 2013-09-12 DIAGNOSIS — R079 Chest pain, unspecified: Secondary | ICD-10-CM

## 2013-09-12 NOTE — Progress Notes (Signed)
Exercise Treadmill Test  Pre-Exercise Testing Evaluation Rhythm: normal sinus  Rate: 71     Test  Exercise Tolerance Test Ordering MD: Donato Schultz  Interpreting MD: Norma Fredrickson, NP  Unique Test No: 1  Treadmill:  1  Indication for ETT: chest pain - rule out ischemia  Contraindication to ETT: No   Stress Modality: exercise - treadmill  Cardiac Imaging Performed: non   Protocol: standard Bruce - maximal  Max BP:  169/88  Max MPHR (bpm):  150 85% MPR (bpm):  136  MPHR obtained (bpm):  160 % MPHR obtained: 93%  Reached 85% MPHR (min:sec):  6:30 Total Exercise Time (min-sec):  9 minutes  Workload in METS:  10.1 Borg Scale: 15  Reason ETT Terminated:  patient's desire to stop    ST Segment Analysis At Rest: normal ST segments - no evidence of significant ST depression With Exercise: no evidence of significant ST depression  Other Information Arrhythmia:  No Angina during ETT:  absent (0) Quality of ETT:  diagnostic  ETT Interpretation:  normal - no evidence of ischemia by ST analysis  Comments: Patient presents today for routine GXT. Has had atypical chest pain. No history of HTN, HLD or FH. Husband had CABG back in October of 2013 which heightened her concern.  Today the patient exercised on the standard Bruce protocol for a total of 9 minutes.  Good exercise tolerance.  Adequate blood pressure response.  Clinically negative for chest pain. Test was stopped due to fatigue/achievement of target heart rate.  EKG negative for ischemia. No significant arrhythmia noted. One PVC noted.  Recommendations: Reassurance/CV risk factor modification  See back prn  Patient is agreeable to this plan and will call if any problems develop in the interim.   Rosalio Macadamia, RN, ANP-C [facility] [address] [place], [place]  [address]

## 2013-09-13 ENCOUNTER — Ambulatory Visit (INDEPENDENT_AMBULATORY_CARE_PROVIDER_SITE_OTHER): Payer: BC Managed Care – PPO | Admitting: Nurse Practitioner

## 2013-09-13 ENCOUNTER — Encounter: Payer: Self-pay | Admitting: Nurse Practitioner

## 2013-09-13 VITALS — BP 124/76 | HR 76 | Ht 61.0 in | Wt 171.0 lb

## 2013-09-13 DIAGNOSIS — N904 Leukoplakia of vulva: Secondary | ICD-10-CM

## 2013-09-13 DIAGNOSIS — N949 Unspecified condition associated with female genital organs and menstrual cycle: Secondary | ICD-10-CM

## 2013-09-13 DIAGNOSIS — R102 Pelvic and perineal pain: Secondary | ICD-10-CM

## 2013-09-13 DIAGNOSIS — L940 Localized scleroderma [morphea]: Secondary | ICD-10-CM

## 2013-09-13 NOTE — Progress Notes (Signed)
Subjective:     Patient ID: Christy Rojas, female   DOB: 1951-05-08, 62 y.o.   MRN: [id number]  HPI  This 62 yo WM Fe present for a recheck of yeast vaginitis and LSA.  States she is much better and responded well with the vaginal cream.  She has used small amounts of Clobetasol for the LSA. Still has flare of LSA at the 6:00 position, but better at top of vulva.  She has not noted any further pink vaginal spotting - but is still concerned about a 'stitch ' being present from her hysterectomy.  She wants to proceed with PUS.    Review of Systems  Constitutional: Negative for fever, chills and fatigue.  HENT: Negative.   Respiratory: Negative.   Cardiovascular:       She had stress test done yesterday for atypical chest pain and it was normal.  Gastrointestinal: Negative.   Genitourinary: Negative.  Negative for dysuria, urgency, frequency, hematuria, decreased urine volume, vaginal bleeding, vaginal discharge, enuresis, genital sores, menstrual problem and pelvic pain.       Not SA since last here.  Musculoskeletal:       Still pain in right foot.  Skin: Negative.   Neurological: Negative.   Psychiatric/Behavioral: Negative.        Objective:   Physical Exam  Constitutional: She appears well-developed and well-nourished. No distress.  Abdominal: Soft. She exhibits no distension. There is no tenderness. There is no rebound and no guarding.  Genitourinary:  Areas of LSA are unchanged - with worst flare at 6:00 position.  No vaginal discharge. No blood with swab of posterior fornix. On bimanual pain again at the left side.       Assessment:     Yeast vaginitis resolved LSA with flare - unresolved History of removal of a Stitch in 2010 from previous hysterectomy scar intravaginally    Plan:     Continue to use A&D ointment with Zinc during the day prn comfort Use Clobetasol at HS to affected areas until cleared Will try Pro B daily to help prevent yeast vaginitis Will get  PUS and follow with Dr.Miller

## 2013-09-13 NOTE — Patient Instructions (Signed)
Pro B is a probiotic that you take once daily to help keep vaginal PH normal. I will place order for pelvic ultrasound and someone will call you to schedule

## 2013-09-15 NOTE — Progress Notes (Signed)
Reviewed personally.  M. Suzanne Jendaya Gossett, MD.  

## 2013-09-19 ENCOUNTER — Other Ambulatory Visit: Payer: Self-pay | Admitting: *Deleted

## 2013-09-19 DIAGNOSIS — N949 Unspecified condition associated with female genital organs and menstrual cycle: Secondary | ICD-10-CM

## 2013-09-22 ENCOUNTER — Ambulatory Visit (INDEPENDENT_AMBULATORY_CARE_PROVIDER_SITE_OTHER): Payer: BC Managed Care – PPO

## 2013-09-22 ENCOUNTER — Ambulatory Visit (INDEPENDENT_AMBULATORY_CARE_PROVIDER_SITE_OTHER): Payer: BC Managed Care – PPO | Admitting: Obstetrics & Gynecology

## 2013-09-22 ENCOUNTER — Encounter: Payer: Self-pay | Admitting: Obstetrics & Gynecology

## 2013-09-22 VITALS — BP 100/66 | HR 74 | Resp 16 | Wt 170.0 lb

## 2013-09-22 DIAGNOSIS — N952 Postmenopausal atrophic vaginitis: Secondary | ICD-10-CM

## 2013-09-22 DIAGNOSIS — N949 Unspecified condition associated with female genital organs and menstrual cycle: Secondary | ICD-10-CM

## 2013-09-22 NOTE — Patient Instructions (Signed)
2 medications for you to consider:  Osphena  Vagifem

## 2013-09-22 NOTE — Progress Notes (Signed)
62 y.o.Marriedfemale here for a pelvic ultrasound.  Pt well known to me.  H/O TAH/BSO due to endometriosis.  Pt several years ago was having issues with bleeding and discomfort with intercourse.  She was seen at [facility] and had an area of "abnormal tissue" removed that was consistent with an old stitch.  She had an episode of vaginal bleeding/spotting this winter and wants to make sure everything looks ok.  She is not have any of the previous issues although she reports she is not as sexually active as she used to be.  Still, when she is SA, she does not have pain or bleeding.  No LMP recorded. Patient has had a hysterectomy.  Sexually active:  yes  Contraception: hysterectomy  FINDINGS: UTERUS: absent EMS: n/a ADNEXA:   Left ovary surgically absent   Right ovary surgically absent CUL DE SAC: neg for free fluid  Results reviewed.  Images discussed.  At this time, I offered to do a pelvic exam so I can visualize vaginal apex.  Pt agreed.  On exam cuff is well healed without masses/bleeding/tenderness.  She does have atropic changes which could account for her spotting/bleeding.  She is not on hormones and doesn't want to be.  Discussed Vagifem, osphena and other options for non hormonal treatment of atrophy.  Assessment:  Vaginal bleeding, vaginal atrophy, no evidence of stitch or foreign body Plan: Pt reassured.  She will attempt some non-hormonal, OTC methods and call with any new issues/concerns.  She will let me know if wants to start vagifem or osphena.  ~15 minutes spent with patient >50% of time was in face to face discussion of above.

## 2013-09-26 ENCOUNTER — Encounter: Payer: Self-pay | Admitting: Podiatry

## 2013-09-26 ENCOUNTER — Ambulatory Visit (INDEPENDENT_AMBULATORY_CARE_PROVIDER_SITE_OTHER): Payer: BC Managed Care – PPO

## 2013-09-26 ENCOUNTER — Ambulatory Visit (INDEPENDENT_AMBULATORY_CARE_PROVIDER_SITE_OTHER): Payer: BC Managed Care – PPO | Admitting: Podiatry

## 2013-09-26 VITALS — BP 121/81 | HR 70 | Resp 18

## 2013-09-26 DIAGNOSIS — M79609 Pain in unspecified limb: Secondary | ICD-10-CM

## 2013-09-26 DIAGNOSIS — M659 Synovitis and tenosynovitis, unspecified: Secondary | ICD-10-CM

## 2013-09-26 DIAGNOSIS — M722 Plantar fascial fibromatosis: Secondary | ICD-10-CM

## 2013-09-26 NOTE — Patient Instructions (Signed)
Plantar Fasciitis (Heel Spur Syndrome) with Rehab The plantar fascia is a fibrous, ligament-like, soft-tissue structure that spans the bottom of the foot. Plantar fasciitis is a condition that causes pain in the foot due to inflammation of the tissue. SYMPTOMS   Pain and tenderness on the underneath side of the foot.  Pain that worsens with standing or walking. CAUSES  Plantar fasciitis is caused by irritation and injury to the plantar fascia on the underneath side of the foot. Common mechanisms of injury include:  Direct trauma to bottom of the foot.  Damage to a small nerve that runs under the foot where the main fascia attaches to the heel bone.  Stress placed on the plantar fascia due to bone spurs. RISK INCREASES WITH:   Activities that place stress on the plantar fascia (running, jumping, pivoting, or cutting).  Poor strength and flexibility.  Improperly fitted shoes.  Tight calf muscles.  Flat feet.  Failure to warm-up properly before activity.  Obesity. PREVENTION  Warm up and stretch properly before activity.  Allow for adequate recovery between workouts.  Maintain physical fitness:  Strength, flexibility, and endurance.  Cardiovascular fitness.  Maintain a health body weight.  Avoid stress on the plantar fascia.  Wear properly fitted shoes, including arch supports for individuals who have flat feet. PROGNOSIS  If treated properly, then the symptoms of plantar fasciitis usually resolve without surgery. However, occasionally surgery is necessary. RELATED COMPLICATIONS   Recurrent symptoms that may result in a chronic condition.  Problems of the lower back that are caused by compensating for the injury, such as limping.  Pain or weakness of the foot during push-off following surgery.  Chronic inflammation, scarring, and partial or complete fascia tear, occurring more often from repeated injections. TREATMENT  Treatment initially involves the use of  ice and medication to help reduce pain and inflammation. The use of strengthening and stretching exercises may help reduce pain with activity, especially stretches of the Achilles tendon. These exercises may be performed at home or with a therapist. Your caregiver may recommend that you use heel cups of arch supports to help reduce stress on the plantar fascia. Occasionally, corticosteroid injections are given to reduce inflammation. If symptoms persist for greater than 6 months despite non-surgical (conservative), then surgery may be recommended.  MEDICATION   If pain medication is necessary, then nonsteroidal anti-inflammatory medications, such as aspirin and ibuprofen, or other minor pain relievers, such as acetaminophen, are often recommended.  Do not take pain medication within 7 days before surgery.  Prescription pain relievers may be given if deemed necessary by your caregiver. Use only as directed and only as much as you need.  Corticosteroid injections may be given by your caregiver. These injections should be reserved for the most serious cases, because they may only be given a certain number of times. HEAT AND COLD  Cold treatment (icing) relieves pain and reduces inflammation. Cold treatment should be applied for 10 to 15 minutes every 2 to 3 hours for inflammation and pain and immediately after any activity that aggravates your symptoms. Use ice packs or massage the area with a piece of ice (ice massage).  Heat treatment may be used prior to performing the stretching and strengthening activities prescribed by your caregiver, physical therapist, or athletic trainer. Use a heat pack or soak the injury in warm water. SEEK IMMEDIATE MEDICAL CARE IF:  Treatment seems to offer no benefit, or the condition worsens.  Any medications produce adverse side effects. EXERCISES RANGE   OF MOTION (ROM) AND STRETCHING EXERCISES - Plantar Fasciitis (Heel Spur Syndrome) These exercises may help you  when beginning to rehabilitate your injury. Your symptoms may resolve with or without further involvement from your physician, physical therapist or athletic trainer. While completing these exercises, remember:   Restoring tissue flexibility helps normal motion to return to the joints. This allows healthier, less painful movement and activity.  An effective stretch should be held for at least 30 seconds.  A stretch should never be painful. You should only feel a gentle lengthening or release in the stretched tissue. RANGE OF MOTION - Toe Extension, Flexion  Sit with your right / left leg crossed over your opposite knee.  Grasp your toes and gently pull them back toward the top of your foot. You should feel a stretch on the bottom of your toes and/or foot.  Hold this stretch for __________ seconds.  Now, gently pull your toes toward the bottom of your foot. You should feel a stretch on the top of your toes and or foot.  Hold this stretch for __________ seconds. Repeat __________ times. Complete this stretch __________ times per day.  RANGE OF MOTION - Ankle Dorsiflexion, Active Assisted  Remove shoes and sit on a chair that is preferably not on a carpeted surface.  Place right / left foot under knee. Extend your opposite leg for support.  Keeping your heel down, slide your right / left foot back toward the chair until you feel a stretch at your ankle or calf. If you do not feel a stretch, slide your bottom forward to the edge of the chair, while still keeping your heel down.  Hold this stretch for __________ seconds. Repeat __________ times. Complete this stretch __________ times per day.  STRETCH  Gastroc, Standing  Place hands on wall.  Extend right / left leg, keeping the front knee somewhat bent.  Slightly point your toes inward on your back foot.  Keeping your right / left heel on the floor and your knee straight, shift your weight toward the wall, not allowing your back to  arch.  You should feel a gentle stretch in the right / left calf. Hold this position for __________ seconds. Repeat __________ times. Complete this stretch __________ times per day. STRETCH  Soleus, Standing  Place hands on wall.  Extend right / left leg, keeping the other knee somewhat bent.  Slightly point your toes inward on your back foot.  Keep your right / left heel on the floor, bend your back knee, and slightly shift your weight over the back leg so that you feel a gentle stretch deep in your back calf.  Hold this position for __________ seconds. Repeat __________ times. Complete this stretch __________ times per day. STRETCH  Gastrocsoleus, Standing  Note: This exercise can place a lot of stress on your foot and ankle. Please complete this exercise only if specifically instructed by your caregiver.   Place the ball of your right / left foot on a step, keeping your other foot firmly on the same step.  Hold on to the wall or a rail for balance.  Slowly lift your other foot, allowing your body weight to press your heel down over the edge of the step.  You should feel a stretch in your right / left calf.  Hold this position for __________ seconds.  Repeat this exercise with a slight bend in your right / left knee. Repeat __________ times. Complete this stretch __________ times per day.    STRENGTHENING EXERCISES - Plantar Fasciitis (Heel Spur Syndrome)  These exercises may help you when beginning to rehabilitate your injury. They may resolve your symptoms with or without further involvement from your physician, physical therapist or athletic trainer. While completing these exercises, remember:   Muscles can gain both the endurance and the strength needed for everyday activities through controlled exercises.  Complete these exercises as instructed by your physician, physical therapist or athletic trainer. Progress the resistance and repetitions only as guided. STRENGTH - Towel  Curls  Sit in a chair positioned on a non-carpeted surface.  Place your foot on a towel, keeping your heel on the floor.  Pull the towel toward your heel by only curling your toes. Keep your heel on the floor.  If instructed by your physician, physical therapist or athletic trainer, add ____________________ at the end of the towel. Repeat __________ times. Complete this exercise __________ times per day. STRENGTH - Ankle Inversion  Secure one end of a rubber exercise band/tubing to a fixed object (table, pole). Loop the other end around your foot just before your toes.  Place your fists between your knees. This will focus your strengthening at your ankle.  Slowly, pull your big toe up and in, making sure the band/tubing is positioned to resist the entire motion.  Hold this position for __________ seconds.  Have your muscles resist the band/tubing as it slowly pulls your foot back to the starting position. Repeat __________ times. Complete this exercises __________ times per day.  Document Released: 09/22/2005 Document Revised: 12/15/2011 Document Reviewed: 01/04/2009 [facility] Patient Information 2014 [employer], [place]. Posterior Tibial Tendon Tendinitis with Rehab Tendonitis is a condition that is characterized by inflammation of a tendon or the lining (sheath) that surrounds it. The inflammation is usually caused by damage to the tendon, such as a tendon tear (strain). Sprains are classified into three categories. Grade 1 sprains cause pain, but the tendon is not lengthened. Grade 2 sprains include a lengthened ligament due to the ligament being stretched or partially ruptured. With grade 2 sprains there is still function, although the function may be diminished. Grade 3 sprains are characterized by a complete tear of the tendon or muscle, and function is usually impaired. Posterior tibialis tendonitis is tendonitis of the posterior tibial tendon, which attaches muscles of the lower leg to  the foot. The posterior tibial tendon is located in the back of the ankle and helps the body straighten (plantarflex) and rotate inward (medially rotate) the ankle. SYMPTOMS   Pain, tenderness, swelling, warmth, and/or redness over the back of the inner ankle at the posterior tibial tendon or the inner part of the mid-foot.  Pain that worsens with plantarflexion or medial rotation of the ankle.  A crackling sound (crepitation) when the tendon is moved or touched. CAUSES  Posterior tibial tendonitis occurs when damage to the posterior tibial tendon starts an inflammatory response. Common mechanisms of injury include:  Degenerative (occurs with aging) processes that weaken the tendon and make it more susceptible to injury.  Stress placed on the tendon from an increase in the intensity, frequency, or duration of training.  Direct trauma to the ankle.  Returning to activity before a previous ankle injury is allowed to heal. RISK INCREASES WITH:  Activities that involve repetitive and/or stressful plantarflexion (jumping, kicking, or running up/down hills).  Poor strength and flexibility.  Flat feet.  Previous injury to the foot, ankle, or leg. PREVENTION   Warm up and stretch properly before activity.  Allow  for adequate recovery between workouts.  Maintain physical fitness:  Strength, flexibility, and endurance.  Cardiovascular fitness.  Learn and use proper technique. When possible, have a coach correct improper technique.  Complete rehabilitation from a previous foot, ankle, or leg injury.  If you have flat feet, wear arch supports (orthotics). PROGNOSIS  If treated properly, then the symptoms of tendonitis usually resolve within 6 weeks. This period may be shorter for injuries caused by direct trauma. RELATED COMPLICATIONS   Prolonged healing time, if improperly treated or re-injured.  Recurrent symptoms that result in a chronic problem.  Partial or complete tendon  tear (rupture) requiring surgery. TREATMENT  Treatment initially involves the use of ice and medication to help reduce pain and inflammation. The use of strengthening and stretching exercises may help reduce pain with activity. These exercises may be performed at home or with referral to a therapist. Often times, your caregiver will recommend immobilizing the ankle to allow the tendon to heal. If you have flat feet, the you may be advised to wear orthotic arch supports. If symptoms persist for greater than 6 months despite non-surgical (conservative) treatment, then surgery may be recommended. MEDICATION   If pain medication is necessary, then nonsteroidal anti-inflammatory medications, such as aspirin and ibuprofen, or other minor pain relievers, such as acetaminophen, are often recommended.  Do not take pain medication for 7 days before surgery.  Prescription pain relievers may be given if deemed necessary by your caregiver. Use only as directed and only as much as you need.  Corticosteroid injections may be given by your caregiver. These injections should be reserved for the most serious cases, because they may only be given a certain number of times. HEAT AND COLD  Cold treatment (icing) relieves pain and reduces inflammation. Cold treatment should be applied for 10 to 15 minutes every 2 to 3 hours for inflammation and pain and immediately after any activity that aggravates your symptoms. Use ice packs or massage the area with a piece of ice (ice massage).  Heat treatment may be used prior to performing the stretching and strengthening activities prescribed by your caregiver, physical therapist, or athletic trainer. Use a heat pack or soak the injury in warm water. SEEK MEDICAL CARE IF:  Treatment seems to offer no benefit, or the condition worsens.  Any medications produce adverse side effects. EXERCISES RANGE OF MOTION (ROM) AND STRETCHING EXERCISES - Posterior Tibial Tendon  Tendinitis These exercises may help you when beginning to rehabilitate your injury. Your symptoms may resolve with or without further involvement from your physician, physical therapist or athletic trainer. While completing these exercises, remember:   Restoring tissue flexibility helps normal motion to return to the joints. This allows healthier, less painful movement and activity.  An effective stretch should be held for at least 30 seconds.  A stretch should never be painful. You should only feel a gentle lengthening or release in the stretched tissue. RANGE OF MOTION - Ankle Plantar Flexion   Sit with your right / left leg crossed over your opposite knee.  Use your opposite hand to pull the top of your foot and toes toward you.  You should feel a gentle stretch on the top of your foot/ankle. Hold this position for __________ seconds. Repeat __________ times. Complete this exercise __________ times per day.  RANGE OF MOTION - Ankle Eversion   Sit with your right / left ankle crossed over your opposite knee.  Grip your foot with your opposite hand, placing your thumb  on the top of your foot and your fingers across the bottom of your foot.  Gently push your foot downward with a slight rotation so your littlest toes rise slightly  You should feel a gentle stretch on the inside of your ankle. Hold the stretch for __________ seconds. Repeat __________ times. Complete this exercise __________ times per day.  RANGE OF MOTION - Ankle Inversion   Sit with your right / left ankle crossed over your opposite knee.  Grip your foot with your opposite hand, placing your thumb on the bottom of your foot and your fingers across the top of your foot.  Gently pull your foot so the smallest toe comes toward you and your thumb pushes the inside of the ball of your foot away from you.  You should feel a gentle stretch on the outside of your ankle. Hold the stretch for __________ seconds. Repeat  __________ times. Complete this exercise __________ times per day.  RANGE OF MOTION - Dorsi/Plantar Flexion  While sitting with your right / left knee straight, draw the top of your foot upwards by flexing your ankle. Then reverse the motion, pointing your toes downward.  Hold each position for __________ seconds.  After completing your first set of exercises, repeat this exercise with your knee bent. Repeat __________ times. Complete this exercise __________ times per day.  RANGE OF MOTION - Ankle Alphabet  Imagine your right / left big toe is a pen.  Keeping your hip and knee still, write out the entire alphabet with your "pen." Make the letters as large as you can without increasing any discomfort. Repeat __________ times. Complete this exercise __________ times per day.  STRETCH - Gastrocsoleus   Sit with your right / left leg extended. Holding onto both ends of a belt or towel, loop it around the ball of your foot.  Keeping your right / left ankle and foot relaxed and your knee straight, pull your foot and ankle toward you using the belt/towel.  You should feel a gentle stretch behind your calf or knee. Hold this position for __________ seconds. Repeat __________ times. Complete this exercise __________ times per day.  STRETCH  Gastroc, Standing   Place hands on wall.  Extend right / left leg, keeping the front knee somewhat bent.  Slightly point your toes inward on your back foot.  Keeping your right / left heel on the floor and your knee straight, shift your weight toward the wall, not allowing your back to arch.  You should feel a gentle stretch in the right / left calf. Hold this position for __________ seconds. Repeat __________ times. Complete this stretch __________ times per day. STRETCH  Soleus, Standing   Place hands on wall.  Extend right / left leg, keeping the other knee somewhat bent.  Slightly point your toes inward on your back foot.  Keep your right /  left heel on the floor, bend your back knee, and slightly shift your weight over the back leg so that you feel a gentle stretch deep in your back calf.  Hold this position for __________ seconds. Repeat __________ times. Complete this stretch __________ times per day. STRENGTHENING EXERCISES - Posterior Tibial Tendon Tendinitis These exercises may help you when beginning to rehabilitate your injury. They may resolve your symptoms with or without further involvement from your physician, physical therapist or athletic trainer. While completing these exercises, remember:   Muscles can gain both the endurance and the strength needed for everyday activities through  controlled exercises.  Complete these exercises as instructed by your physician, physical therapist or athletic trainer. Progress the resistance and repetitions only as guided. STRENGTH - Dorsiflexors  Secure a rubber exercise band/tubing to a fixed object (ie. table, pole) and loop the other end around your right / left foot.  Sit on the floor facing the fixed object. The band/tubing should be slightly tense when your foot is relaxed.  Slowly draw your foot back toward you using your ankle and toes.  Hold this position for __________ seconds. Slowly release the tension in the band and return your foot to the starting position. Repeat __________ times. Complete this exercise __________ times per day.  STRENGTH - Towel Curls  Sit in a chair positioned on a non-carpeted surface.  Place your foot on a towel, keeping your heel on the floor.  Pull the towel toward your heel by only curling your toes. Keep your heel on the floor.  If instructed by your physician, physical therapist or athletic trainer, add ____________________ at the end of the towel. Repeat __________ times. Complete this exercise __________ times per day. STRENGTH - Ankle Eversion   Secure one end of a rubber exercise band/tubing to a fixed object (table, pole).  Loop the other end around your foot just before your toes.  Place your fists between your knees. This will focus your strengthening at your ankle.  Drawing the band/tubing across your opposite foot, slowly, pull your little toe out and up. Make sure the band/tubing is positioned to resist the entire motion.  Hold this position for __________ seconds.  Have your muscles resist the band/tubing as it slowly pulls your foot back to the starting position. Repeat __________ times. Complete this exercise __________ times per day.  STRENGTH - Ankle Inversion   Secure one end of a rubber exercise band/tubing to a fixed object (table, pole). Loop the other end around your foot just before your toes.  Place your fists between your knees. This will focus your strengthening at your ankle.  Slowly, pull your big toe up and in, making sure the band/tubing is positioned to resist the entire motion.  Hold this position for __________ seconds.  Have your muscles resist the band/tubing as it slowly pulls your foot back to the starting position. Repeat __________ times. Complete this exercises __________ times per day.  Document Released: 09/22/2005 Document Revised: 12/15/2011 Document Reviewed: 01/04/2009 [facility] Patient Information 2014 [place], [place].

## 2013-09-26 NOTE — Progress Notes (Signed)
   Subjective:    Patient ID: Christy Rojas, female    DOB: 12/13/1950, 62 y.o.   MRN: [id number]  HPI both arches have been hurting me for about 2 months and was [place] the voting [place] for almost 13 1/2 hours that day and I stood all day and then I went to [place] on Saturday Aug 13, 2013 due to not being able to put my foot done and the right is the worst and they gave me a boot to wear and wore it for about 2 weeks and sore and tenderness on both    Review of Systems  Constitutional: Negative.   HENT: Negative.   Eyes: Positive for itching.  Respiratory: Positive for wheezing.   Cardiovascular: Negative.   Gastrointestinal: Negative.   Endocrine: Negative.   Genitourinary: Negative.   Musculoskeletal: Negative.        Joint and muscle pain  Skin: Negative.   Allergic/Immunologic: Negative.   Neurological: Negative.   Hematological: Bruises/bleeds easily.  Psychiatric/Behavioral: Negative.        Objective:   Physical Exam  Orientated x87 62 year old white female  Vascular: DP and PT pulses are two over four bilaterally  Neurological: Knee and ankle reflexes equal and reactive bilaterally  Dermatological: Texture and turgor within normal limits  Musculoskeletal: Patient is able to heel off unilaterally and bilaterally. There is no evidence of too many toe signs bilaterally. Palpable tenderness medial right navicular without any palpable lesions. This area duplicates her primary painful site on the right foot. There is no erythema edema surrounding the site. Mild palpable tenderness in the left mid fascial band, without a palpable lesions.        Assessment & Plan:   Assessment: Insertional posterior tibial tendinopathy right Plantar fasciitis left The existing custom foot orthotics are  not contouring well. Replace with new custom orthotics.  Plan: Patient is advised to wear her existing Cam Walker on the right foot for 6 weeks. In addition a digital scan  was obtained for the indication of plantar fasciitis left and posterior tibial tendinopathy right. A sport orthotic with extrinsic rear foot post to vertical, and forefoot intrinsic to cast, right and DP ulcer and extend to MPJ area bilaterally.  Patient is also advised to DC tennis and substitute exercise bike or swimming. Return up on receipt of custom foot orthotics.

## 2013-10-03 ENCOUNTER — Ambulatory Visit (INDEPENDENT_AMBULATORY_CARE_PROVIDER_SITE_OTHER): Payer: BC Managed Care – PPO | Admitting: Podiatry

## 2013-10-03 ENCOUNTER — Telehealth: Payer: Self-pay | Admitting: *Deleted

## 2013-10-03 ENCOUNTER — Encounter: Payer: Self-pay | Admitting: Podiatry

## 2013-10-03 VITALS — BP 120/79 | HR 83 | Resp 18

## 2013-10-03 DIAGNOSIS — M659 Synovitis and tenosynovitis, unspecified: Secondary | ICD-10-CM

## 2013-10-03 NOTE — Progress Notes (Signed)
   Subjective:    Patient ID: Christy Rojas, female    DOB: 11-25-1950, 62 y.o.   MRN: [id number]  HPI my right foot is better and it is tender and my left foot hurts now due to me wearing the boot on my right foot  The symptoms and left foot began about 1 day ago  And presents for followup care after wearing Cam Walker boot on the right foot this the visit of 09/26/2013. The right foot symptoms reduced in the past day the left foot became tender in the mid foot. She stopped wearing a Cam Walker boot right in the past 24 hours  Review of Systems     Objective:   Physical Exam Orientated x4 62 year old white female  Dermatological: Texture and turgor within normal limits. No skin lesions noted, or erythema and edema noted bilaterally.  Musculoskeletal: Mild palpable tenderness medial right navicular area. Moderate palpable tenderness medial left navicular area. Patient is able to heel off bilaterally. She is able to heel off on the right. She is not able to heel off on the left.       Assessment & Plan:   Assessment: Improving insertional posterior tibial tendinopathy, right foot Posterior insertional tendinopathy, left foot  Plan: DC Cam Walker boot right Transfer Cam Walker boot to the left foot/leg x4 weeks Limit standing and walking. DC tennis  until all symptoms resolve.

## 2013-10-03 NOTE — Patient Instructions (Signed)
Wear boot on left foot leg. Stop all exercise other than relative rest.

## 2013-10-03 NOTE — Telephone Encounter (Signed)
WORKED PT IN TODAY

## 2013-10-03 NOTE — Telephone Encounter (Signed)
Pt states has been wearing the boot on her right foot as advised by Dr Leeanne Deed on 09/26/2013, now her left foot hurts, so bad she can not walk.  I told pt she may have a different injury to the left foot, or a overuse due to the right foot being offloaded, that needed to be evaluated since it occurred after it was x-rayed on 09/26/2013.  Referred pt to the schedulers.

## 2013-10-11 ENCOUNTER — Telehealth: Payer: Self-pay | Admitting: *Deleted

## 2013-10-11 NOTE — Telephone Encounter (Signed)
Pt asked if orthotics are in.  The orthotics are not in, I left a message that I would have our orthotic lab tech call for status.

## 2013-10-12 NOTE — Telephone Encounter (Signed)
Called pt-she was scanned on Dec 22nd and orthotics ordered same day. Informed her that it may be a delay getting these orthotics due to the holidays and the lab being closed. Told her that I would put in a [facility] request to see if we could get them sooner and I will contact her once they arrive.

## 2013-10-17 NOTE — Telephone Encounter (Signed)
Pt's orthotics arrived today. Notified pt by phone, left message, that they were here and that she could call our office to schedule an appt for pickup.

## 2013-10-20 ENCOUNTER — Ambulatory Visit (INDEPENDENT_AMBULATORY_CARE_PROVIDER_SITE_OTHER): Payer: BC Managed Care – PPO | Admitting: Podiatry

## 2013-10-20 ENCOUNTER — Encounter: Payer: Self-pay | Admitting: Podiatry

## 2013-10-20 VITALS — BP 134/79 | HR 80 | Resp 16

## 2013-10-20 DIAGNOSIS — M65979 Unspecified synovitis and tenosynovitis, unspecified ankle and foot: Secondary | ICD-10-CM

## 2013-10-20 DIAGNOSIS — M659 Synovitis and tenosynovitis, unspecified: Secondary | ICD-10-CM

## 2013-10-20 NOTE — Progress Notes (Signed)
Patient ID: Christy Rojas, female   DOB: November 24, 1950, 63 y.o.   MRN: [id number]  Subjective: Patient presents today for dispensing of custom foot orthotics. She still complaining of pain in the medial navicular areas bilaterally.  Objective: Palpable tenderness medial navicular areas bilaterally without any erythema, edema noted. Custom orthotics contour satisfactorily.  Assessment: Insertional posterior tibial tendinopathy bilaterally at the level of the navicular area.  Plan: Patient was advised not to play tennis until the pain in the medial navicular area stops. The replacement custom orthotics were dispensed with wearing instructions provided. Reappoint x6 weeks

## 2013-10-20 NOTE — Patient Instructions (Signed)

## 2013-10-25 ENCOUNTER — Encounter: Payer: Self-pay | Admitting: Certified Nurse Midwife

## 2013-10-25 ENCOUNTER — Ambulatory Visit (INDEPENDENT_AMBULATORY_CARE_PROVIDER_SITE_OTHER): Payer: BC Managed Care – PPO | Admitting: Certified Nurse Midwife

## 2013-10-25 VITALS — BP 118/68 | HR 74 | Resp 16 | Ht 61.0 in | Wt 168.0 lb

## 2013-10-25 DIAGNOSIS — Z01419 Encounter for gynecological examination (general) (routine) without abnormal findings: Secondary | ICD-10-CM

## 2013-10-25 DIAGNOSIS — N904 Leukoplakia of vulva: Secondary | ICD-10-CM | POA: Insufficient documentation

## 2013-10-25 DIAGNOSIS — L940 Localized scleroderma [morphea]: Secondary | ICD-10-CM

## 2013-10-25 DIAGNOSIS — R319 Hematuria, unspecified: Secondary | ICD-10-CM

## 2013-10-25 DIAGNOSIS — Z0000 Encounter for general adult medical examination without abnormal findings: Secondary | ICD-10-CM

## 2013-10-25 LAB — POCT URINALYSIS DIPSTICK
PH UA: 5
Urobilinogen, UA: NEGATIVE

## 2013-10-25 LAB — HEMOGLOBIN, FINGERSTICK: HEMOGLOBIN, FINGERSTICK: 13.7 g/dL (ref 12.0–16.0)

## 2013-10-25 NOTE — Patient Instructions (Signed)

## 2013-10-25 NOTE — Progress Notes (Signed)
63 y.o. G1P1 Married Caucasian Fe here for annual exam.   Menopausal no HRT. Patient denies any vaginal bleeding or vaginal dryness issues now. Patient continues to use Olive Oil with good results. 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 Resolving with physical therapy. Patient denies urinary frequency,urgency,or pain with urination.Patient complaining of external vaginal itching for the past 2 weeks. Patient used Terazol cream with out any changes. No new personal products. Sees PCP for labs and aex and medication management. No other health issues today.  No LMP recorded. Patient has had a hysterectomy.          Sexually active: no  The current method of family planning is status post hysterectomy and post menopausal status.    Exercising: yes  tennis, jazzercise,walking (hasn't been exersing in the last 4 months due to tendonitis in foot) Smoker:  no  Health Maintenance: Pap:  10/07/2011 Negative  MMG:  2014 Bi-rads 1  Colonoscopy:  06/2006 BMD:   2 years ago TDaP:  2008 Labs: Hgb: 13.7 ; Urine: Leuks 2, Trace Blood   reports that she has never smoked. She has never used smokeless tobacco. She reports that she drinks about 3.0 ounces of alcohol per week. She reports that she does not use illicit drugs.  Past Medical History  Diagnosis Date  . Asthma   . Rectal fissure 12/97  . Endometriosis     resolved by TAH  . Hyperlipidemia     Past Surgical History  Procedure Laterality Date  . Abdominal hysterectomy    . Tonsillectomy and adenoidectomy  1961    Current Outpatient Prescriptions  Medication Sig Dispense Refill  . ALPRAZolam (XANAX) 0.5 MG tablet Take 0.5 mg by mouth at bedtime as needed. To help sleep       . celecoxib (CELEBREX) 200 MG capsule Take 200 mg by mouth as needed for pain.      . Cyanocobalamin (VITAMIN B-12 PO) Take 1 tablet by mouth daily.        . cyclobenzaprine (FLEXERIL) 10 MG tablet Take 10 mg by mouth daily as needed. For  pain       . esomeprazole (NEXIUM) 40 MG capsule Take 40 mg by mouth daily before breakfast. 1/2 tab      . fluticasone-salmeterol (ADVAIR HFA) 230-21 MCG/ACT inhaler Inhale 2 puffs into the lungs daily as needed. For shortness of breath      . ibuprofen (ADVIL,MOTRIN) 200 MG tablet 200 mg every 6 (six) hours as needed for pain.      Marland Kitchen lactobacillus acidophilus (BACID) TABS tablet Take 2 tablets by mouth 3 (three) times daily.      Marland Kitchen loratadine (CLARITIN) 10 MG tablet Take 10 mg by mouth daily.      . mometasone (NASONEX) 50 MCG/ACT nasal spray Place 2 sprays into the nose as needed.      . montelukast (SINGULAIR) 10 MG tablet Take 10 mg by mouth at bedtime.        . Multiple Vitamins-Minerals (MULTIVITAMINS THER. W/MINERALS) TABS Take 1 tablet by mouth daily.        . Nutritional Supplements (ESTROVEN PO) Take by mouth as directed. One tab at bed      . Omega-3 Fatty Acids (FISH OIL PO) Take 1 capsule by mouth daily.        . Plant Sterols and Stanols (CHOLEST OFF PO) Take 1 tablet by mouth daily.        Marland Kitchen terconazole (TERAZOL 7)  0.4 % vaginal cream Place 1 applicator vaginally at bedtime. One applicator full QHS for seven days of therapy  45 g  0  . VOLTAREN 1 % GEL as directed.      . zolpidem (AMBIEN) 10 MG tablet Take 1 tablet by mouth at bedtime as needed.      . zolpidem (AMBIEN) 10 MG tablet Take 1 tablet (10 mg total) by mouth at bedtime as needed for sleep.  10 tablet  0   No current facility-administered medications for this visit.    Family History  Problem Relation Age of Onset  . Breast cancer Mother 51    ROS:  Pertinent items are noted in HPI.  Otherwise, a comprehensive ROS was negative.  Exam:   BP 118/68  Pulse 74  Resp 16  Ht 5\' 1"  (1.549 m)  Wt 168 lb (76.204 kg)  BMI 31.76 kg/m2 Height: 5\' 1"  (154.9 cm)  Ht Readings from Last 3 Encounters:  10/25/13 5\' 1"  (1.549 m)  09/13/13 5\' 1"  (1.549 m)  08/30/13 5\' 1"  (1.549 m)    General appearance: alert,  cooperative and appears stated age Head: Normocephalic, without obvious abnormality, atraumatic Neck: no adenopathy, supple, symmetrical, trachea midline and thyroid normal to inspection and palpation Lungs: clear to auscultation bilaterally Breasts: normal appearance, no masses or tenderness, No nipple retraction or dimpling, No nipple discharge or bleeding, No axillary or supraclavicular adenopathy Heart: regular rate and rhythm CVAT bilateral negative Abdomen: soft, non-tender; no masses,  no organomegaly, negative suprapubic pain Extremities: extremities normal, atraumatic, no cyanosis or edema Skin: Skin color, texture, turgor normal. No rashes or lesions Lymph nodes: Cervical, supraclavicular, and axillary nodes normal. No abnormal inguinal nodes palpated Neurologic: Grossly normal   Pelvic: External genitalia:  no lesions,              Urethra:  normal appearing urethra with no masses, tenderness or lesions, bladder and urethral meatus not tender              Bartholin's and Skene's: normal                 Vagina: normal appearing vagina with normal color and discharge, no lesions, at fourchette area of vagina lichen sclerosus noted again only small area noted with lace like appearance              Cervix: absent              Pap taken: no Bimanual Exam:  Uterus:  uterus absent              Adnexa: no mass, fullness, tenderness and adnexal bilateral absent               Rectovaginal: Confirms               Anus:  normal sphincter tone, no lesions  A:  Well Woman with normal exam  Menopausal no HRT S/P TAH,BSO due to abnormal pap smear, pathology negative  History of Lichen Sclerosus, flaring again, patient has Rx Clobetasol to restart treatment  R/O UTI  2+ WBC, tr. Blood in urine  P:   Reviewed health and wellness pertinent to exam  Aware of need to evaluate if vaginal bleeding  Restart medication and continue for 2 weeks twice daily as prescribed and then once daily for 2  weeks. Patient aware.  Lab: Urine micro,   Pap smear as per guidelines   Mammogram yearly pap smear not taken today  counseled on breast self exam, mammography screening, adequate intake of calcium and vitamin D, diet and exercise, Kegel's exercises  return annually or prn  An After Visit Summary was printed and given to the patient.

## 2013-10-25 NOTE — Progress Notes (Signed)
Reviewed personally.  M. Suzanne Drea Jurewicz, MD.  

## 2013-10-26 ENCOUNTER — Other Ambulatory Visit: Payer: Self-pay | Admitting: Certified Nurse Midwife

## 2013-10-26 LAB — URINALYSIS, MICROSCOPIC ONLY
BACTERIA UA: NONE SEEN
CRYSTALS: NONE SEEN
Casts: NONE SEEN
SQUAMOUS EPITHELIAL / LPF: NONE SEEN

## 2013-10-26 NOTE — Telephone Encounter (Signed)
eScribe request from [facility] for refill on TEMOVATE CREAM Last AEX - 10/25/13  Please advise refills.  Med not on med list.

## 2013-11-30 ENCOUNTER — Ambulatory Visit: Payer: BC Managed Care – PPO | Admitting: Podiatry

## 2014-03-15 ENCOUNTER — Other Ambulatory Visit: Payer: Self-pay | Admitting: Certified Nurse Midwife

## 2014-03-16 NOTE — Telephone Encounter (Signed)
Last AEX 10/25/13 Last refill 08/30/13 #10 / 0 refills  Please approve or deny Rx.

## 2014-03-23 ENCOUNTER — Ambulatory Visit (INDEPENDENT_AMBULATORY_CARE_PROVIDER_SITE_OTHER): Payer: BC Managed Care – PPO | Admitting: Sports Medicine

## 2014-03-23 ENCOUNTER — Encounter: Payer: Self-pay | Admitting: Sports Medicine

## 2014-03-23 VITALS — BP 119/84 | Ht 61.0 in | Wt 160.0 lb

## 2014-03-23 DIAGNOSIS — M25579 Pain in unspecified ankle and joints of unspecified foot: Secondary | ICD-10-CM

## 2014-03-23 NOTE — Patient Instructions (Signed)
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

## 2014-03-23 NOTE — Progress Notes (Signed)
   Christy Rojas is a 63 y.o. female who is here for a bilateral medial foot pain   HPI:  Patient evaluated by Dr. Maryln Gottron. She had evidence for bilateral posterior tibial tendinitis. 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 After that she did a dance class and played tennis on the next 2 days. By the third day she had a lot of bilateral foot pain.  Foot pain was evaluated and she was getting injections at the orthopedics office. 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 Lots of standing.  Got better with shot. Left foot did not get better at all. Right foot, the injection helped. Previously was swollen, but now swelling has improved.   Feeling better can play tennis. Going to physical therapy, doing all the exercises, but worried that it could be a chronic problem. Wears body helix while she exercies  Pain radiates to the navicular prominence. She occasionally gets numbness of the right second toe and some tingling in the forefoot.  Physical Exam:  BP 119/84  Ht 5\' 1"  (1.549 m)  Wt 160 lb (72.576 kg)  BMI 30.25 kg/m2 No acute distress  Lateral and transverse arch breakdown Lateral column is separated bilaterally Right foot rotates outward about 15 versus 5 on the left with walking gait No tenderness or swelling over the posterior tibialis tendon today in either foot Heel raise shows normal posterior tibial function  Tarsal, metatarsal bossing.   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 She needs correction of the biomechanical breakdown with orthotics. - Return for appointment for custom orthotic  - in meantime, wear good arch support     Martin[place], Katherine, MD  03/23/2014  Ila Mcgill, MD

## 2014-03-23 NOTE — Assessment & Plan Note (Signed)
Most of this appears to be structural and biomechanical in nature  She does have some elements of tarsal tunnel syndrome with occasional numbness but not consistently  I do not think she has evidence of any ongoing posterior tibialis tendinitis today  We will plan on orthotics and try to get her to use good support in all shoes

## 2014-04-14 ENCOUNTER — Ambulatory Visit (INDEPENDENT_AMBULATORY_CARE_PROVIDER_SITE_OTHER): Payer: BC Managed Care – PPO | Admitting: Family Medicine

## 2014-04-14 VITALS — BP 118/76 | Ht 61.0 in | Wt 158.0 lb

## 2014-04-14 DIAGNOSIS — M25579 Pain in unspecified ankle and joints of unspecified foot: Secondary | ICD-10-CM

## 2014-04-14 DIAGNOSIS — M2041 Other hammer toe(s) (acquired), right foot: Secondary | ICD-10-CM

## 2014-04-14 DIAGNOSIS — M2040 Other hammer toe(s) (acquired), unspecified foot: Secondary | ICD-10-CM

## 2014-04-14 DIAGNOSIS — M2042 Other hammer toe(s) (acquired), left foot: Principal | ICD-10-CM

## 2014-04-14 NOTE — Assessment & Plan Note (Signed)
Made her some orthotics today and placed metatarsal pads under her transverse arch.

## 2014-04-14 NOTE — Progress Notes (Signed)
Patient ID: MORNING HALBERG, female   DOB: [birth year], 63 y.o.   MRN: [id number]  MILEAH HEMMER - 63 y.o. female MRN [id number]  Date of birth: 17-Jul-1951    SUBJECTIVE:     Bilateral foot pain, right greater than left. Long-standing. Hurts mostly when she is up on her feet a lot. She's previously had some orthotics but has not worn them for quite some time. Over the last few months her foot pain is been getting worse. She's also had some increasing knee pain on the right that she thinks is related to the foot pain. 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 ROS:     No unusual weight change. Positive for knee pain. Negative for fever, sweats, chills. Negative for foot numbness.  PERTINENT  PMH / PSH FH / / SH:  Past Medical, Surgical, Social, and Family History Reviewed & Updated in the EMR.  Pertinent findings include:  Prior history of knee surgery. Arthroscopy. Recently treated for tendinitis of the right foot with immobilization in a fracture walker boot and corticosteroid injection.  OBJECTIVE: BP 118/76  Ht 5\' 1"  (1.549 m)  Wt 158 lb (71.668 kg)  BMI 29.87 kg/m2  Physical Exam:  Vital signs are reviewed. Vital signs are reviewed GENERAL: Well-developed female no acute distress FEET: Bilaterally she has significant loss of the transverse arch on both feet, right more severe than left. She has hammertoes of all the toes on the right foot and some partial hammering of the toe #2 and #1 left foot. Significant pes planus with medial foot collapse bilaterally right greater than left. GAIT: Toe out with overpronation of the right foot area shortened stride length on the right.  Patient was fitted for a : standard, cushioned, semi-rigid orthotic. The orthotic was heated, placed on the orthotic stand. The patient was positioned in subtalar neutral position and 10 degrees of ankle dorsiflexion in a weight bearing stance on the heated orthotic  blank After completion of molding, a stable base was applied to the orthotic blank. The blank was ground to a stable position for weight bearing. Blank: Red Base: White Posting: Bilateral metatarsal pads, extra small.  Face to face time spent in evaluation, measurement and manufacture of custom molded orthotic was 45 minutes.    ASSESSMENT & PLAN:  See problem based charting & AVS for pt instructions.

## 2014-04-14 NOTE — Assessment & Plan Note (Signed)
Custom molded orthotics today

## 2014-04-26 ENCOUNTER — Encounter: Payer: BC Managed Care – PPO | Admitting: Sports Medicine

## 2014-05-02 ENCOUNTER — Encounter: Payer: BC Managed Care – PPO | Admitting: Sports Medicine

## 2014-08-07 ENCOUNTER — Encounter: Payer: Self-pay | Admitting: Sports Medicine

## 2014-08-23 ENCOUNTER — Encounter: Payer: Self-pay | Admitting: Sports Medicine

## 2014-08-23 ENCOUNTER — Ambulatory Visit (INDEPENDENT_AMBULATORY_CARE_PROVIDER_SITE_OTHER): Payer: BC Managed Care – PPO | Admitting: Sports Medicine

## 2014-08-23 VITALS — BP 114/58

## 2014-08-23 DIAGNOSIS — M2041 Other hammer toe(s) (acquired), right foot: Secondary | ICD-10-CM | POA: Diagnosis not present

## 2014-08-23 DIAGNOSIS — M2042 Other hammer toe(s) (acquired), left foot: Secondary | ICD-10-CM | POA: Diagnosis not present

## 2014-08-23 MED ORDER — DICLOFENAC SODIUM 1 % TD GEL: 2.0000 g | TRANSDERMAL | Status: AC

## 2014-08-23 NOTE — Progress Notes (Signed)
Patient ID: Christy Rojas, female   DOB: 1951-03-15, 63 y.o.   MRN: [id number]  Patient with a long history of foot pain Dr. Milta Deiters made her orthotics which have been helpful Dr. 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

## 2014-08-23 NOTE — Assessment & Plan Note (Signed)
I cut back the base support on both orthotics so as not to put pressure over the MTP joints  On the undersurface of the orthotic I placed a felt pad to act as a metatarsal support and unload the pressure only MTP joints  Her walking gait was more neutral in the orthotics with this in place  She plans to test this strategy over the next month   We will modify the cushion if needed  Continue to wear good supportive shoes when she is unable to use her orthotics

## 2014-10-30 ENCOUNTER — Ambulatory Visit (INDEPENDENT_AMBULATORY_CARE_PROVIDER_SITE_OTHER): Payer: BLUE CROSS/BLUE SHIELD | Admitting: Certified Nurse Midwife

## 2014-10-30 ENCOUNTER — Encounter: Payer: Self-pay | Admitting: Certified Nurse Midwife

## 2014-10-30 VITALS — BP 104/62 | HR 68 | Resp 16 | Ht 61.25 in | Wt 149.0 lb

## 2014-10-30 DIAGNOSIS — Z0000 Encounter for general adult medical examination without abnormal findings: Secondary | ICD-10-CM

## 2014-10-30 DIAGNOSIS — Z1211 Encounter for screening for malignant neoplasm of colon: Secondary | ICD-10-CM

## 2014-10-30 DIAGNOSIS — Z01419 Encounter for gynecological examination (general) (routine) without abnormal findings: Secondary | ICD-10-CM

## 2014-10-30 LAB — POCT URINALYSIS DIPSTICK
BILIRUBIN UA: NEGATIVE
Blood, UA: NEGATIVE
Glucose, UA: NEGATIVE
Ketones, UA: NEGATIVE
Leukocytes, UA: NEGATIVE
Nitrite, UA: NEGATIVE
Protein, UA: NEGATIVE
Urobilinogen, UA: NEGATIVE
pH, UA: 5

## 2014-10-30 NOTE — Progress Notes (Signed)
64 y.o. G1P1 Married  Caucasian Fe here for annual exam. Menopausal no HRT, no vaginal bleeding.Using Olive for  vaginal dryness concerns. Sees PCP for aex, medication management. Working on [employer] with watchers, now with 20 pound loss!. Doing well, good year. Will be losing insurance at end of year, trying to get her check ups in. No health issues today.  No LMP recorded. Patient has had a hysterectomy.          Sexually active: No.  The current method of family planning is status post hysterectomy.    Exercising: Yes.    tennis,walking & yoga Smoker:  no  Health Maintenance: Pap:  10-07-11 neg MMG: 04-13-14 category b density birads 2:neg Colonoscopy:  06/2006 neg BMD:   2013 TDaP:  2015 Labs: Poct urine-neg Self breast exam: done occ   reports that she has never smoked. She has never used smokeless tobacco. She reports that she drinks about 0.6 - 1.2 oz of alcohol per week. She reports that she does not use illicit drugs.  Past Medical History  Diagnosis Date  . Asthma   . Rectal fissure 12/97  . Endometriosis     resolved by TAH  . Hyperlipidemia     Past Surgical History  Procedure Laterality Date  . Abdominal hysterectomy    . Tonsillectomy and adenoidectomy  1961    Current Outpatient Prescriptions  Medication Sig Dispense Refill  . ADVAIR DISKUS 100-50 MCG/DOSE AEPB     . ALPRAZolam (XANAX) 0.5 MG tablet Take 0.5 mg by mouth at bedtime as needed. To help sleep     . clobetasol cream (TEMOVATE) 0.05 % APPLY THINLY TO AFFECTED AREA DAILY FOR 2 WEEKS ATONSET OF FLAIR 60 g 0  . Cyanocobalamin (VITAMIN B-12 PO) Take 1 tablet by mouth daily.      . diclofenac sodium (VOLTAREN) 1 % GEL Apply 2 g topically as directed. 300 Tube 5  . esomeprazole (NEXIUM) 40 MG capsule Take 20 mg by mouth daily before breakfast.     . lactobacillus acidophilus (BACID) TABS tablet Take 2 tablets by mouth 3 (three) times daily.    Marland Kitchen loratadine (CLARITIN) 10 MG tablet Take 10 mg by mouth daily.     . mometasone (NASONEX) 50 MCG/ACT nasal spray Place 2 sprays into the nose as needed.    . montelukast (SINGULAIR) 10 MG tablet Take 10 mg by mouth at bedtime.      . Multiple Vitamins-Minerals (MULTIVITAMINS THER. W/MINERALS) TABS Take 1 tablet by mouth daily.      . Omega-3 Fatty Acids (FISH OIL PO) Take 1 capsule by mouth daily.      . Plant Sterols and Stanols (CHOLEST OFF PO) Take 1 tablet by mouth daily.      . VENTOLIN HFA 108 (90 BASE) MCG/ACT inhaler     . zolpidem (AMBIEN) 10 MG tablet take A 1/2 tablet by mouth if needed for insomnia 10 tablet 1   No current facility-administered medications for this visit.    Family History  Problem Relation Age of Onset  . Breast cancer Mother 43    ROS:  Pertinent items are noted in HPI.  Otherwise, a comprehensive ROS was negative.  Exam:   BP 104/62 mmHg  Pulse 68  Resp 16  Ht 5' 1.25" (1.556 m)  Wt 149 lb (67.586 kg)  BMI 27.91 kg/m2 Height: 5' 1.25" (155.6 cm) Ht Readings from Last 3 Encounters:  10/30/14 5' 1.25" (1.556 m)  04/14/14 5\' 1"  (1.549 m)  03/23/14 5\' 1"  (1.549 m)    General appearance: alert, cooperative and appears stated age Head: Normocephalic, without obvious abnormality, atraumatic Neck: no adenopathy, supple, symmetrical, trachea midline and thyroid normal to inspection and palpation Lungs: clear to auscultation bilaterally Breasts: normal appearance, no masses or tenderness, No nipple retraction or dimpling, No nipple discharge or bleeding, No axillary or supraclavicular adenopathy Heart: regular rate and rhythm Abdomen: soft, non-tender; no masses,  no organomegaly Extremities: extremities normal, atraumatic, no cyanosis or edema Skin: Skin color, texture, turgor normal. No rashes or lesions Lymph nodes: Cervical, supraclavicular, and axillary nodes normal. No abnormal inguinal nodes palpated Neurologic: Grossly normal   Pelvic: External genitalia:  no lesions              Urethra:  normal  appearing urethra with no masses, tenderness or lesions              Bartholin's and Skene's: normal                 Vagina: normal appearing vagina with normal color and discharge, no lesions, small cracked skin area at fourchette,non tender, no exudate, no evidence of LS              Cervix: absent              Pap taken: No. Bimanual Exam:  Uterus:  uterus absent              Adnexa: no mass, fullness, tenderness and adnexa absent bilateral.               Rectovaginal: Confirms               Anus:  normal sphincter tone, no lesions    A:  Well Woman with normal exam  Menopausal no HRT S/P TAH with BSO  Family History of Breast cancer mother age 53  History of LS  Vaginal dryness  P:   Reviewed health and wellness pertinent to exam  Stressed importance of SBE and mammogram  Discussed no evidence of LS noted today, but noted small area of dryness with cracked skin. Discussed changing from [place] to coconut oil to area( shown to patient with mirror) may protect and moisture better. Patient will try, has some on hand.  Pap smear not taken today  counseled on breast self exam, mammography screening, feminine hygiene, osteoporosis, adequate intake of calcium and vitamin D, diet and exercise, IFOB dispensed  return annually or prn  An After Visit Summary was printed and given to the patient.

## 2014-10-30 NOTE — Patient Instructions (Signed)

## 2014-10-31 LAB — VITAMIN D 25 HYDROXY (VIT D DEFICIENCY, FRACTURES): Vit D, 25-Hydroxy: 35 ng/mL (ref 30–100)

## 2014-10-31 NOTE — Progress Notes (Signed)
Reviewed personally.  M. Suzanne Champagne Paletta, MD.  

## 2014-12-06 NOTE — Progress Notes (Signed)
Letter sent.

## 2015-11-06 ENCOUNTER — Ambulatory Visit: Payer: BLUE CROSS/BLUE SHIELD | Admitting: Certified Nurse Midwife

## 2015-12-04 ENCOUNTER — Encounter: Payer: Self-pay | Admitting: Certified Nurse Midwife

## 2015-12-04 ENCOUNTER — Ambulatory Visit (INDEPENDENT_AMBULATORY_CARE_PROVIDER_SITE_OTHER): Payer: PRIVATE HEALTH INSURANCE | Admitting: Certified Nurse Midwife

## 2015-12-04 VITALS — BP 108/70 | HR 70 | Resp 16 | Ht 61.25 in | Wt 163.0 lb

## 2015-12-04 DIAGNOSIS — Z01419 Encounter for gynecological examination (general) (routine) without abnormal findings: Secondary | ICD-10-CM

## 2015-12-04 DIAGNOSIS — Z1211 Encounter for screening for malignant neoplasm of colon: Secondary | ICD-10-CM

## 2015-12-04 DIAGNOSIS — N898 Other specified noninflammatory disorders of vagina: Secondary | ICD-10-CM

## 2015-12-04 DIAGNOSIS — L298 Other pruritus: Secondary | ICD-10-CM

## 2015-12-04 NOTE — Progress Notes (Signed)
65 y.o. G1P1 Married  Caucasian Fe here for annual exam. Denies vaginal bleeding or vaginal dryness. Using coconut oil for dryness. Sees Dr. Inda Merlin for aex,medication management of asthma, labs. Continues to play tennis, and yoga routinely.  Complaining of some increase vaginal itching, not sure if yeast infection, no discharge or odor. Denies new personal products. No other health issues today. Pet sitting for friends!  No LMP recorded. Patient has had a hysterectomy.          Sexually active: No.  The current method of family planning is status post hysterectomy.    Exercising: Yes.    walk, tennis,yoga Smoker:  no  Health Maintenance: Pap:  10-07-11 neg MMG:  04-19-15 category b density,birads 1:neg Colonoscopy:  06/2006 neg BMD:   2013 TDaP:  2015 Shingles: 2015 Pneumonia: no Hep C and HIV: not done Labs: pcp Self breast exam: not done   reports that she has never smoked. She has never used smokeless tobacco. She reports that she drinks about 0.6 - 1.2 oz of alcohol per week. She reports that she does not use illicit drugs.  Past Medical History  Diagnosis Date  . Asthma   . Rectal fissure 12/97  . Endometriosis     resolved by TAH  . Hyperlipidemia     Past Surgical History  Procedure Laterality Date  . Abdominal hysterectomy    . Tonsillectomy and adenoidectomy  1961    Current Outpatient Prescriptions  Medication Sig Dispense Refill  . ADVAIR DISKUS 100-50 MCG/DOSE AEPB     . ALPRAZolam (XANAX) 0.5 MG tablet Take 0.5 mg by mouth at bedtime as needed. To help sleep     . Cetirizine HCl (ZYRTEC PO) Take by mouth as needed.    . clobetasol cream (TEMOVATE) 0.05 % APPLY THINLY TO AFFECTED AREA DAILY FOR 2 WEEKS ATONSET OF FLAIR 60 g 0  . Cyanocobalamin (VITAMIN B-12 PO) Take 1 tablet by mouth as needed.     . diclofenac sodium (VOLTAREN) 1 % GEL Apply 2 g topically as directed. 300 Tube 5  . lactobacillus acidophilus (BACID) TABS tablet Take 2 tablets by mouth daily.     Marland Kitchen  loratadine (CLARITIN) 10 MG tablet Take 10 mg by mouth daily.    . montelukast (SINGULAIR) 10 MG tablet Take 10 mg by mouth at bedtime.      . Multiple Vitamins-Minerals (MULTIVITAMINS THER. W/MINERALS) TABS Take 1 tablet by mouth daily.      . Omega-3 Fatty Acids (FISH OIL PO) Take 1 capsule by mouth daily.      . Plant Sterols and Stanols (CHOLEST OFF PO) Take 1 tablet by mouth daily.      . VENTOLIN HFA 108 (90 BASE) MCG/ACT inhaler     . esomeprazole (NEXIUM) 40 MG capsule Take 20 mg by mouth daily before breakfast. Reported on 12/04/2015     No current facility-administered medications for this visit.    Family History  Problem Relation Age of Onset  . Breast cancer Mother 33    ROS:  Pertinent items are noted in HPI.  Otherwise, a comprehensive ROS was negative.  Exam:   BP 108/70 mmHg  Pulse 70  Resp 16  Ht 5' 1.25" (1.556 m)  Wt 163 lb (73.936 kg)  BMI 30.54 kg/m2 Height: 5' 1.25" (155.6 cm) Ht Readings from Last 3 Encounters:  12/04/15 5' 1.25" (1.556 m)  10/30/14 5' 1.25" (1.556 m)  04/14/14 5\' 1"  (1.549 m)    General appearance: alert,  cooperative and appears stated age Head: Normocephalic, without obvious abnormality, atraumatic Neck: no adenopathy, supple, symmetrical, trachea midline and thyroid normal to inspection and palpation Lungs: clear to auscultation bilaterally Breasts: normal appearance, no masses or tenderness, No nipple retraction or dimpling, No nipple discharge or bleeding, No axillary or supraclavicular adenopathy Heart: regular rate and rhythm Abdomen: soft, non-tender; no masses,  no organomegaly Extremities: extremities normal, atraumatic, no cyanosis or edema Skin: Skin color, texture, turgor normal. No rashes or lesions Lymph nodes: Cervical, supraclavicular, and axillary nodes normal. No abnormal inguinal nodes palpated Neurologic: Grossly normal   Pelvic: External genitalia:  no lesions              Urethra:  normal appearing urethra  with no masses, tenderness or lesions              Bartholin's and Skene's: normal                 Vagina: normal appearing vagina with slight redness in color and scant discharge, no lesions affirm taken              Cervix: absent              Pap taken: No. Bimanual Exam:  Uterus:  uterus absent              Adnexa: no mass, fullness, tenderness and adnexal absent               Rectovaginal: Confirms               Anus:  normal sphincter tone, no lesions  Chaperone present: yes  A:  Well Woman with normal exam  Menopausal no HRT s/p TAH and BSO  Due for colonoscopy and needs referral  Vaginal itching R/O infection, vaginal dryness  P:   Reviewed health and wellness pertinent to exam  Aware to call if vaginal dryness changes  Will treat per affirm if indicated, discussed using coconut oil routinely to help with dryness issues   Pap smear as above not taken  IFOB dispensed and will call for referral to Dr. Collene Mares when ready   counseled on breast self exam, mammography screening, adequate intake of calcium and vitamin D, diet and exercise  return annually or prn  An After Visit Summary was printed and given to the patient.

## 2015-12-04 NOTE — Patient Instructions (Signed)

## 2015-12-04 NOTE — Progress Notes (Signed)
Reviewed personally.  M. Suzanne Hayes Czaja, MD.  

## 2015-12-05 LAB — WET PREP BY MOLECULAR PROBE
CANDIDA SPECIES: NEGATIVE
GARDNERELLA VAGINALIS: NEGATIVE
TRICHOMONAS VAG: NEGATIVE

## 2015-12-06 ENCOUNTER — Telehealth: Payer: Self-pay | Admitting: Certified Nurse Midwife

## 2015-12-06 NOTE — Telephone Encounter (Signed)
Call to patient. Detailed message left on mobile okay per designated party release form. Advised new kit at front desk for her to pick up at her convenience.  Routing to provider for final review. Patient agreeable to disposition. Will close encounter.

## 2015-12-06 NOTE — Telephone Encounter (Signed)
Patient called requesting another IFOB kit.  She lost a part of the first one.  Patient requests a call back when ready for pick up please.

## 2015-12-14 LAB — FECAL OCCULT BLOOD, IMMUNOCHEMICAL: IMMUNOLOGICAL FECAL OCCULT BLOOD TEST: NEGATIVE

## 2016-04-04 ENCOUNTER — Encounter: Payer: Self-pay | Admitting: Family Medicine

## 2016-04-04 ENCOUNTER — Ambulatory Visit (INDEPENDENT_AMBULATORY_CARE_PROVIDER_SITE_OTHER): Payer: PRIVATE HEALTH INSURANCE | Admitting: Family Medicine

## 2016-04-04 VITALS — BP 110/70 | Ht 61.0 in | Wt 155.0 lb

## 2016-04-04 DIAGNOSIS — M25579 Pain in unspecified ankle and joints of unspecified foot: Secondary | ICD-10-CM | POA: Diagnosis not present

## 2016-04-04 NOTE — Progress Notes (Signed)
   Subjective:    Patient ID: Christy Rojas, female    DOB: 01/03/1951, 65 y.o.   MRN: [id number]  HPI Foot pain, bilateral  Right foot pain is mostly over the third MTP joint. She has some pain in the second and fourth joints but most bleed the third joint. Pain is with standing or walking. She uses her custom molded orthotics about 50% of the time and wonders if she would have improvement in her foot pain if she used them more regularly. The left foot is bothering her with pain on the medial portion of her salt. This happens mostly after she does long-standing or walking activities.   Review of Systems No unusual weight change, fever, sweats, chills. No numbness or tingling in her feet.    Objective:   Physical Exam Vital signs reviewed GEN.: Well-developed female, overweight, no acute distress FEET: Bilaterally she has loss of the transverse arch. Wants to March is also flattened. She has hammertoes developing on the right on 34 and 5. The fourth toe is actually rotated and Crossing under the third toe. She has mild tenderness to palpation over the third MTP joint and the third fourth interspace is negative for squeeze test. The left foot reveals some piezogenic papules medially. There is slight midfoot collapse medially.       Assessment & Plan:  Bilateral foot pain. Her orthotics are showing quite a bit aware. ID had medial posting on the left heel to offset the stress just a little bit. On her right foot I put a small neuroma pad and the place where we would typically use a metatarsal pad and she has not been able to tolerate the extra small metatarsal pad in the past. We'll see how this does for her

## 2016-05-08 ENCOUNTER — Encounter: Payer: Self-pay | Admitting: Obstetrics & Gynecology

## 2016-09-25 ENCOUNTER — Encounter: Payer: Self-pay | Admitting: Sports Medicine

## 2016-09-25 ENCOUNTER — Ambulatory Visit (INDEPENDENT_AMBULATORY_CARE_PROVIDER_SITE_OTHER): Payer: PRIVATE HEALTH INSURANCE | Admitting: Sports Medicine

## 2016-09-25 ENCOUNTER — Ambulatory Visit: Payer: Self-pay

## 2016-09-25 VITALS — BP 130/74 | Ht 61.0 in | Wt 168.0 lb

## 2016-09-25 DIAGNOSIS — M2042 Other hammer toe(s) (acquired), left foot: Secondary | ICD-10-CM

## 2016-09-25 DIAGNOSIS — M79671 Pain in right foot: Secondary | ICD-10-CM | POA: Diagnosis not present

## 2016-09-25 DIAGNOSIS — M2041 Other hammer toe(s) (acquired), right foot: Secondary | ICD-10-CM | POA: Diagnosis not present

## 2016-09-25 MED ORDER — NITROGLYCERIN 0.2 MG/HR TD PT24: MEDICATED_PATCH | TRANSDERMAL | 1 refills | Status: DC

## 2016-09-25 NOTE — Assessment & Plan Note (Signed)
More consistent with Achilles tendonopathy than plantar fasciitis. Palpable nodule noted on physical exam of Achilles, as well as on ultrasound. Hypoechoic area consistent with inflammation with presence of fluid along tendon sheath, suggestive of chronic gastroc strain, likely due to tennis. 0.5cm bone spur also noted on heel, though does not appear to be cutting into plantar fascia. Plantar fascia normal in size on [place] (0.45 cm), also making plantar fasciitis less likely.  - Begin NTG protocol - Heel lifts placed in orthotics bilaterally today - Patient given at home exercises and stretches for Achilles and plantar fascia - Refrain from tennis for two weeks, but can continue walking dogs as long as wearing orthotics - F/u in 6-8 weeks

## 2016-09-25 NOTE — Patient Instructions (Signed)

## 2016-09-25 NOTE — Progress Notes (Signed)
Subjective:    Patient ID: Christy Rojas, female    DOB: 10-28-1950, 65 y.o.   MRN: [id number]  HPI  Patient presents for check of orthotics and R foot pain.   Patient reports history of plantar fasciitis 40 years ago. Says that she received an injection at that time, and has not experienced that pain again until 2.5 months ago. Since then pain has been intermittent. Pain is sometimes located on her heel, and other times near her Achilles. She did have pain with first step in the morning, though this has since resolved. She has been seeing a chiropractor who has massaged her leg/calf/foot, and this provides some symptomatic relief.   She has also been doing ROM, strengthening, and stretching exercises for her foot, as well as yoga. She is also icing her foot and does not walk barefoot, even around the house. 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 As such, she cannot fully take a break from exercise to rest her foot as she would like to. She recently bought new Brooks walking shoes with high arch support which she says has improved her symptoms some.   Review of Systems Denies numbness or tingling in R foot.  No swelling in foot or ankle    Objective:   Physical Exam  Constitutional: She is oriented to person, place, and time. She appears well-developed and well-nourished. No distress.  HENT:  Head: Normocephalic and atraumatic.  Pulmonary/Chest: Effort normal. No respiratory distress.  Musculoskeletal:  R foot:  No obvious bony abnormalities or asymmetry No swelling, erythema, ecchymosis Palpable nodule over Achilles tendon Tenderness to palpation of Achilles No TTP elsewhere Good ROM Full strength with dorsi- and plantar flexion Hammering of fourth and fifth toes bilaterally   Neurological: She is alert and oriented to person, place, and time.  Skin: Skin is warm and dry.  Psychiatric:  She has a normal mood and affect. Her behavior is normal.   Ultrasound of Right heel At 4 cm. 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 Fields, MD     Assessment & Plan:  Acquired hammer toes of both feet Noted again on physical exam today. Patient has been given metatarsal pads in the past, however found them very uncomfortable and took them out. Will focus on Achilles tendon rehab today and add heel lifts to orthotics for that purpose. Can readdress hammer toes at next appointment.   Pain of right heel More consistent with Achilles tendonopathy than plantar fasciitis. Palpable nodule noted on physical exam of Achilles, as well as on ultrasound. Hypoechoic area consistent with inflammation with presence of fluid along tendon sheath, suggestive of chronic gastroc strain, likely due to tennis. 0.5cm bone spur also noted on heel, though does not appear to be cutting into plantar fascia. Plantar fascia normal in size on [place] (0.45 cm), also making plantar fasciitis less likely.  - Begin NTG protocol - Heel lifts placed in orthotics bilaterally today - Patient given at home exercises and stretches for Achilles and  plantar fascia - Refrain from tennis for two weeks, but can continue walking dogs as long as wearing orthotics - F/u in 6-8 weeks   Adin Hector, MD, MPH PGY-2 [facility] Pager [phone]  I observed and examined the patient with the resident and agree with assessment and plan.  Note reviewed and modified by me. Stefanie Libel, MD

## 2016-09-25 NOTE — Assessment & Plan Note (Signed)
Noted again on physical exam today. Patient has been given metatarsal pads in the past, however found them very uncomfortable and took them out. Will focus on Achilles tendon rehab today and add heel lifts to orthotics for that purpose. Can readdress hammer toes at next appointment.

## 2016-11-27 ENCOUNTER — Ambulatory Visit (INDEPENDENT_AMBULATORY_CARE_PROVIDER_SITE_OTHER): Payer: PRIVATE HEALTH INSURANCE | Admitting: Sports Medicine

## 2016-11-27 ENCOUNTER — Encounter: Payer: Self-pay | Admitting: Sports Medicine

## 2016-11-27 DIAGNOSIS — M2041 Other hammer toe(s) (acquired), right foot: Secondary | ICD-10-CM | POA: Diagnosis not present

## 2016-11-27 DIAGNOSIS — M79671 Pain in right foot: Secondary | ICD-10-CM | POA: Diagnosis not present

## 2016-11-27 DIAGNOSIS — M2042 Other hammer toe(s) (acquired), left foot: Secondary | ICD-10-CM | POA: Diagnosis not present

## 2016-11-27 NOTE — Assessment & Plan Note (Addendum)
A: She has not had significant improvement in her symptoms since last follow up, however, she has not fully adhered to the treatment plan with discontinuing her NTG protocol and not wearing her custom orthotics as much as she should.  She has some discomfort with home exercises which can be modified as well as some deficiency in her kinetic chain which may be slowing her recovery.    P: - we have discussed continuing NTG protocol but will decrease size of patches to be about 1/8 patch to minimize any potential adverse effect - modify her home exercises and incorporate hip strengthening exercises.  She was given handouts today - added midfoot cushion to her custom orthotics and will schedule her to have new orthotics made.  She will also attempt to use her heel lift just on the Right side - she will wear her custom orthotics more regularly

## 2016-11-27 NOTE — Progress Notes (Signed)
   Subjective:    Patient ID: SACHA HA, female    DOB: 1951/07/20, 66 y.o.   MRN: [id number]  HPI Patient presents for f/u of her right foot pain felt to be consistent with Achilles tendinopathy.  Last OV was 09/25/2016 and started on NTG protocol, given heel lifts and strengthening and stretching exercises to do at home.  Since that time, she reports wearing the heel lifts for 1 week but then took them out due to pain in her left knee and arch.  However, she thinks it helped with pain on the right.  She did the NTG patches for about 4 weeks but then stopped because she reports it affected her BP and she had an episode of "dizziness" getting out of the shower while vacationing at the beach.  She has been doing her exercises daily but finds the calf exercises bothersome at her Achillles tendon when her knee is bent.  She also experiences some discomfort with first morning steps at the base of her right heel, but no significant pain.  She has otherwise been using Voltaren gel and ibuprofen with mild relief as well as occasional ice.    She took 2 full weeks off from tennis and then played on clay surface courts and did okay.  This week she played back-to-back days on hard surface courts and had aggravation of her pain.  She continues her dog-walking activities without significant exacerbation of pain.  She wears her Brooks walking shoes with manufacture insoles for walking and while playing tennis, she wears custom orthotics.   Review of Systems Please see pertinent ROS reviewed in HPI and problem based charting.     Objective:   Physical Exam General: sitting on exam table, pleasant, no distress HEENT: NCAT, EOMI, no scleral icterus CV: no edema Pulm: normal effort MSK: Right foot - no obvious swelling or deformity, (+) tenderness at Achilles tendon, (-) tenderness at base of heel, full ROM, 5/5 strength with foot, hammer toe of 4th and 5th toes, (+) TTP mild over calcaneal spur Left foot  - no obvious deformity, full ROM, hammer toes of 4th and 5th toes with callus She also has weakness with gluteus medius testing    Assessment & Plan:   Acquired hammer toes of both feet A: Noted on physical exam, do not cause significant clinical discomfort.  There is some callus formation top of 4th left toe.  P: - given hammer toe pads today to use  Pain of right heel A: She has not had significant improvement in her symptoms since last follow up, however, she has not fully adhered to the treatment plan with discontinuing her NTG protocol and not wearing her custom orthotics as much as she should.  She has some discomfort with home exercises which can be modified as well as some deficiency in her kinetic chain which may be slowing her recovery.    P: - we have discussed continuing NTG protocol but will decrease size of patches to be about 1/8 patch to minimize any potential adverse effect - modify her home exercises and incorporate hip strengthening exercises.  She was given handouts today - added midfoot lifts to her custom orthotics and will schedule her to have new orthotics made.  She will also attempt to use her heel lift just on the Right side - she will wear her custom orthotics more regularly

## 2016-11-27 NOTE — Assessment & Plan Note (Signed)
A: Noted on physical exam, do not cause significant clinical discomfort.  There is some callus formation top of 4th left toe.  P: - given hammer toe pads today to use

## 2016-12-03 ENCOUNTER — Ambulatory Visit (INDEPENDENT_AMBULATORY_CARE_PROVIDER_SITE_OTHER): Payer: PRIVATE HEALTH INSURANCE | Admitting: Student

## 2016-12-03 ENCOUNTER — Encounter: Payer: PRIVATE HEALTH INSURANCE | Admitting: Student

## 2016-12-03 ENCOUNTER — Encounter: Payer: Self-pay | Admitting: Student

## 2016-12-03 VITALS — BP 135/97 | Ht 61.0 in | Wt 168.0 lb

## 2016-12-03 DIAGNOSIS — M25579 Pain in unspecified ankle and joints of unspecified foot: Secondary | ICD-10-CM | POA: Diagnosis not present

## 2016-12-03 DIAGNOSIS — M79671 Pain in right foot: Secondary | ICD-10-CM | POA: Diagnosis not present

## 2016-12-03 NOTE — Assessment & Plan Note (Signed)
Custom orthotics molded today.  Tolerated well, took down more arch due to discomfort.

## 2016-12-03 NOTE — Progress Notes (Signed)
Presents for new orthotics, was told that her old ones were wearing.  She has been having achilles pain for which she saw Dr. Oneida Alar last week.  She would like new ones with new padding.    Patient was fitted for a : standard, cushioned, semi-rigid orthotic. The orthotic was heated and afterward the patient stood on the orthotic blank positioned on the orthotic stand. The patient was positioned in subtalar neutral position and 10 degrees of ankle dorsiflexion in a weight bearing stance. After completion of molding, a stable base was applied to the orthotic blank. The blank was ground to a stable position for weight bearing. Size: 8 Base: Blue EVA Posting: Blue pad proximal to metatarsal  Patient was counseled reviewing diagnosis and treatment in detail, totaling in 40 minutes, over half of which was spent in face to face counseling.

## 2016-12-09 ENCOUNTER — Ambulatory Visit: Payer: PRIVATE HEALTH INSURANCE | Admitting: Certified Nurse Midwife

## 2016-12-17 ENCOUNTER — Ambulatory Visit (INDEPENDENT_AMBULATORY_CARE_PROVIDER_SITE_OTHER): Payer: PRIVATE HEALTH INSURANCE | Admitting: Certified Nurse Midwife

## 2016-12-17 ENCOUNTER — Encounter: Payer: Self-pay | Admitting: Certified Nurse Midwife

## 2016-12-17 VITALS — BP 116/80 | HR 70 | Resp 16 | Ht 60.75 in | Wt 171.0 lb

## 2016-12-17 DIAGNOSIS — Z1211 Encounter for screening for malignant neoplasm of colon: Secondary | ICD-10-CM | POA: Diagnosis not present

## 2016-12-17 DIAGNOSIS — Z01419 Encounter for gynecological examination (general) (routine) without abnormal findings: Secondary | ICD-10-CM

## 2016-12-17 DIAGNOSIS — N951 Menopausal and female climacteric states: Secondary | ICD-10-CM | POA: Diagnosis not present

## 2016-12-17 NOTE — Patient Instructions (Signed)

## 2016-12-17 NOTE — Progress Notes (Signed)
66 y.o. G1P1 Married  Caucasian Fe here for annual exam.  Denies any vaginal bleeding or vaginal dryness. Continues with slight irritation at fourchette, but Destin works well to prevent problems with playing tennis. Sees PCP yearly for labs, aex and  Asthma/cholesterol management. No other health issues today.  No LMP recorded. Patient has had a hysterectomy.          Sexually active: No.  The current method of family planning is status post hysterectomy.    Exercising: Yes.    walking,tennis & yoga Smoker:  no  Health Maintenance: Pap:  10-07-11 neg MMG:  04-22-16 category b density birads 2:neg Colonoscopy:  9/07 neg, declines this year BMD:   2013 TDaP:  2015 Shingles: had done Pneumonia: had done Hep C and HIV: not done Labs: none Self breast exam: not done   reports that she has never smoked. She has never used smokeless tobacco. She reports that she drinks about 0.6 oz of alcohol per week . She reports that she does not use drugs.  Past Medical History:  Diagnosis Date  . Asthma   . Endometriosis    resolved by TAH  . Hyperlipidemia   . Rectal fissure 12/97    Past Surgical History:  Procedure Laterality Date  . ABDOMINAL HYSTERECTOMY    . TONSILLECTOMY AND ADENOIDECTOMY  1961    Current Outpatient Prescriptions  Medication Sig Dispense Refill  . ADVAIR DISKUS 100-50 MCG/DOSE AEPB     . ALPRAZolam (XANAX) 0.5 MG tablet Take 0.5 mg by mouth at bedtime as needed. To help sleep     . Cetirizine HCl (ZYRTEC PO) Take by mouth as needed.    . clobetasol cream (TEMOVATE) 0.05 % APPLY THINLY TO AFFECTED AREA DAILY FOR 2 WEEKS ATONSET OF FLAIR 60 g 0  . Cyanocobalamin (VITAMIN B-12 PO) Take 1 tablet by mouth as needed.     . diclofenac sodium (VOLTAREN) 1 % GEL Apply 2 g topically as directed. 300 Tube 5  . esomeprazole (NEXIUM) 40 MG capsule Take 20 mg by mouth daily before breakfast. Reported on 12/04/2015    . lactobacillus acidophilus (BACID) TABS tablet Take 2 tablets by  mouth daily.     Marland Kitchen loratadine (CLARITIN) 10 MG tablet Take 10 mg by mouth daily.    . montelukast (SINGULAIR) 10 MG tablet Take 10 mg by mouth at bedtime.      . Multiple Vitamins-Minerals (MULTIVITAMINS THER. W/MINERALS) TABS Take 1 tablet by mouth daily.      . nitroGLYCERIN (NITRODUR - DOSED IN MG/24 HR) 0.2 mg/hr patch Use 1/4 patch daily to the affected 30 patch 1  . Omega-3 Fatty Acids (FISH OIL PO) Take 1 capsule by mouth daily.      . Plant Sterols and Stanols (CHOLEST OFF PO) Take 1 tablet by mouth daily.      . VENTOLIN HFA 108 (90 BASE) MCG/ACT inhaler      No current facility-administered medications for this visit.     Family History  Problem Relation Age of Onset  . Breast cancer Mother 48    ROS:  Pertinent items are noted in HPI.  Otherwise, a comprehensive ROS was negative.  Exam:   BP 116/80   Pulse 70   Resp 16   Ht 5' 0.75" (1.543 m)   Wt 171 lb (77.6 kg)   BMI 32.58 kg/m  Height: 5' 0.75" (154.3 cm) Ht Readings from Last 3 Encounters:  12/17/16 5' 0.75" (1.543 m)  12/03/16 5'  1" (1.549 m)  11/27/16 5\' 1"  (1.549 m)    General appearance: alert, cooperative and appears stated age Head: Normocephalic, without obvious abnormality, atraumatic Neck: no adenopathy, supple, symmetrical, trachea midline and thyroid normal to inspection and palpation Lungs: clear to auscultation bilaterally Breasts: normal appearance, no masses or tenderness, No nipple retraction or dimpling, No nipple discharge or bleeding, No axillary or supraclavicular adenopathy Heart: regular rate and rhythm Abdomen: soft, non-tender; no masses,  no organomegaly Extremities: extremities normal, atraumatic, no cyanosis or edema Skin: Skin color, texture, turgor normal. No rashes or lesions Lymph nodes: Cervical, supraclavicular, and axillary nodes normal. No abnormal inguinal nodes palpated Neurologic: Grossly normal   Pelvic: External genitalia:  no lesions, normal female, no active  Lichen Sclerosis noted              Urethra:  normal appearing urethra with no masses, tenderness or lesions              Bartholin's and Skene's: normal                 Vagina: normal appearing vagina with normal color and discharge, no lesions,              Cervix: absent              Pap taken: No. Bimanual Exam:  Uterus:  uterus absent              Adnexa: no mass, fullness, tenderness               Rectovaginal: Confirms               Anus:  normal sphincter tone, no lesions  Chaperone present: yes  A:  Well Woman with normal exam  Menopausal no HRT S/P TAH with BSO  Family history of Breast Cancer mother age 62, aware of genetic screening  History of Lichen Sclerosis vulva area  Asthma/cholesterol management with PCP  Colonoscopy due, patient declines, IFOB dispensed  BMD patient declines    P:   Reviewed health and wellness pertinent to exam  Stressed importance of SBE and mammogram yearly  Discussed importance of BMD and osteporosis early detection along with colonoscopy and colon cancer early detection. Patient aware and declines scheduling  Pap smear as above not taken   counseled on breast self exam, mammography screening, menopause, adequate intake of calcium and vitamin D, diet and exercise  return annually or prn  An After Visit Summary was printed and given to the patient.

## 2016-12-22 NOTE — Progress Notes (Signed)
Encounter reviewed Wenzel Backlund, MD   

## 2017-01-08 ENCOUNTER — Encounter: Payer: PRIVATE HEALTH INSURANCE | Admitting: Sports Medicine

## 2017-01-22 ENCOUNTER — Ambulatory Visit: Payer: PRIVATE HEALTH INSURANCE | Admitting: Sports Medicine

## 2017-08-04 ENCOUNTER — Ambulatory Visit (INDEPENDENT_AMBULATORY_CARE_PROVIDER_SITE_OTHER): Payer: PRIVATE HEALTH INSURANCE | Admitting: Podiatry

## 2017-08-04 ENCOUNTER — Ambulatory Visit (INDEPENDENT_AMBULATORY_CARE_PROVIDER_SITE_OTHER): Payer: PRIVATE HEALTH INSURANCE

## 2017-08-04 ENCOUNTER — Encounter: Payer: Self-pay | Admitting: Podiatry

## 2017-08-04 VITALS — BP 117/66 | HR 62 | Resp 16

## 2017-08-04 DIAGNOSIS — M7661 Achilles tendinitis, right leg: Secondary | ICD-10-CM

## 2017-08-04 DIAGNOSIS — L600 Ingrowing nail: Secondary | ICD-10-CM

## 2017-08-04 MED ORDER — NEOMYCIN-POLYMYXIN-HC 1 % OT SOLN: OTIC | 1 refills | Status: DC

## 2017-08-04 NOTE — Progress Notes (Signed)
Subjective:  Patient ID: Christy Rojas, female    DOB: 09-11-1951,  MRN: [id number] HPI Chief Complaint  Patient presents with  . Toe Pain    Hallux right - medial border, tender x few months, had left ingrown removed years ago  . Foot Pain    Achilles tendon right - injury playing tennis Dec. 2017, active in tennis but has stopped for the last few months, currently doing PT (some better), also has used nitrogylcerin patches    66 y.o. female presents with the above complaint.     Past Medical History:  Diagnosis Date  . Asthma   . Endometriosis    resolved by TAH  . Hyperlipidemia   . Rectal fissure 12/97   Past Surgical History:  Procedure Laterality Date  . ABDOMINAL HYSTERECTOMY    . TONSILLECTOMY AND ADENOIDECTOMY  1961    Current Outpatient Prescriptions:  .  ADVAIR DISKUS 100-50 MCG/DOSE AEPB, , Disp: , Rfl:  .  ALPRAZolam (XANAX) 0.5 MG tablet, Take 0.5 mg by mouth at bedtime as needed. To help sleep , Disp: , Rfl:  .  Cetirizine HCl (ZYRTEC PO), Take by mouth as needed., Disp: , Rfl:  .  clobetasol cream (TEMOVATE) 0.05 %, APPLY THINLY TO AFFECTED AREA DAILY FOR 2 WEEKS ATONSET OF FLAIR, Disp: 60 g, Rfl: 0 .  Cyanocobalamin (VITAMIN B-12 PO), Take 1 tablet by mouth as needed. , Disp: , Rfl:  .  diclofenac sodium (VOLTAREN) 1 % GEL, Apply 2 g topically as directed., Disp: 300 Tube, Rfl: 5 .  esomeprazole (NEXIUM) 40 MG capsule, Take 20 mg by mouth daily before breakfast. Reported on 12/04/2015, Disp: , Rfl:  .  montelukast (SINGULAIR) 10 MG tablet, Take 10 mg by mouth at bedtime.  , Disp: , Rfl:  .  Multiple Vitamins-Minerals (MULTIVITAMINS THER. W/MINERALS) TABS, Take 1 tablet by mouth daily.  , Disp: , Rfl:  .  Omega-3 Fatty Acids (FISH OIL PO), Take 1 capsule by mouth daily.  , Disp: , Rfl:   Allergies  Allergen Reactions  . Latex Other (See Comments)    Patient almost died.  . Other Other (See Comments)    Patient states that there is an allergy to  something she received in anesthesia that begins with  (mes). They are pretty sure it was the latex but considered the other one to be an allergy as well since it was a high severity.   Review of Systems  All other systems reviewed and are negative.  Objective:   Vitals:   08/04/17 1002  BP: 117/66  Pulse: 62  Resp: 16    General: Well developed, nourished, in no acute distress, alert and oriented x3   Dermatological: Skin is warm, dry and supple bilateral. Nails x 10 are well maintained; remaining integument appears unremarkable at this time. There are no open sores, no preulcerative lesions, no rash or signs of infection present.ingrown toenailtibial border hallux right.  Vascular: Dorsalis Pedis artery and Posterior Tibial artery pedal pulses are 2/4 bilateral with immedate capillary fill time. Pedal hair growth present. No varicosities and no lower extremity edema present bilateral.   Neruologic: Grossly intact via light touch bilateral. Vibratory intact via tuning fork bilateral. Protective threshold with Semmes Wienstein monofilament intact to all pedal sites bilateral. Patellar and Achilles deep tendon reflexes 2+ bilateral. No Babinski or clonus noted bilateral.   Musculoskeletal: No gross boney pedal deformities bilateral. No pain, crepitus, or limitation noted with foot and ankle range of  motion bilateral. Muscular strength 5/5 in all groups tested bilateral.nonpulsatile nodule mid substance of the Achilles tender on palpation but not warm. No pain or sharp exion of the foot.  Gait: Unassisted, Nonantalgic.    Radiographs:   views radiographs taken of the right foot today demonstrate soft tissue nodule within the mid substance of the Achilles proximal to its insertion. Small spur noted no intratendinous calcification noted.  Assessment & Plan:   Assessment: Achilles tendinitis ingrown toenailtibial border hallux right.  Plan: discussed continued physical therapyof the  Achilles tendon. At this portal we pformed a chemical matrixectomy to the tibial border o hallux right. This was performed after local anesthetic was administered. She tolerated the procedure well. She was provided with both oral and written home-going instructions for care and soaking her toes.She was als instructions and a prescription for Cortisporin Otic.     Tarus Briski T. Blades, [place]

## 2017-08-04 NOTE — Patient Instructions (Signed)

## 2017-08-05 ENCOUNTER — Telehealth: Payer: Self-pay | Admitting: Podiatry

## 2017-08-05 NOTE — Telephone Encounter (Signed)
I had an ingrown procedure done yesterday by Dr. Milinda Pointer. I have a couple of questions.  1. How long do I do the betadine soak for? How many days? I know I soak it twice a day for 20 minutes.  2. How many days should I use neosporin before I stop? I know it won't heal as much if I keep putting it on. 3. Also, when can I wear shoes again that are not open toe? 4. Gym and yoga, when can I start to do that again? Please call me back at [phone] and if I don't answer you can leave a detailed message on my voicemail with the answers to my questions. Thank you.

## 2017-08-05 NOTE — Telephone Encounter (Signed)
I informed pt that she would be performing the soaking and antibiotic ointment for 4-6 weeks, to allow the area to heal from the inside out. I told pt at the end of the 4th week perform the last soak of the day, leave off the neosporin ointment bandaid and if the area got a dry hard scab without redness, swelling or drainage she could stop the soaks, but if the symptoms continued perform the soaks another 2 weeks then test again. I told pt to wear looser deeper toe box shoes and let her comfort level be her guide to her activity. Pt states understanding.

## 2017-08-06 ENCOUNTER — Ambulatory Visit: Payer: PRIVATE HEALTH INSURANCE | Admitting: Podiatry

## 2017-08-18 ENCOUNTER — Ambulatory Visit (INDEPENDENT_AMBULATORY_CARE_PROVIDER_SITE_OTHER): Payer: PRIVATE HEALTH INSURANCE | Admitting: Podiatry

## 2017-08-18 DIAGNOSIS — L600 Ingrowing nail: Secondary | ICD-10-CM

## 2017-08-18 DIAGNOSIS — M7661 Achilles tendinitis, right leg: Secondary | ICD-10-CM

## 2017-08-18 NOTE — Progress Notes (Signed)
She presents today for follow-up of her matrixectomy hallux right. She states that is doing very well she's happy with the outcome. States that she's been soaking in Betadine but had one her dog a step on it. She is still concerned about her Achilles tendon in playing tennis.  Objective: Vital signs are stable she is alert and oriented 3 pulses are palpable. Toe appears to be healing very nicely there is minimal erythema and no edema cellulitis drainage or odor. The nodule in the central substance of the Achilles right appears to be decreasing is no longer warm to the touch is nonpulsatile I do believe that physical therapy is helping this.  Assessment: Tear of the Achilles tendon is more than likely healing very well at this point. Well-healed surgical toe hallux right.  Plan: She will notify me should her right heel and Achilles become painful once again after playing tennis or exercising.

## 2017-12-18 ENCOUNTER — Ambulatory Visit: Payer: PRIVATE HEALTH INSURANCE | Admitting: Certified Nurse Midwife

## 2017-12-30 ENCOUNTER — Other Ambulatory Visit: Payer: Self-pay

## 2017-12-30 ENCOUNTER — Ambulatory Visit (INDEPENDENT_AMBULATORY_CARE_PROVIDER_SITE_OTHER): Payer: PRIVATE HEALTH INSURANCE | Admitting: Certified Nurse Midwife

## 2017-12-30 ENCOUNTER — Encounter: Payer: Self-pay | Admitting: Certified Nurse Midwife

## 2017-12-30 VITALS — BP 120/80 | HR 72 | Resp 16 | Ht 61.25 in | Wt 163.0 lb

## 2017-12-30 DIAGNOSIS — N951 Menopausal and female climacteric states: Secondary | ICD-10-CM | POA: Diagnosis not present

## 2017-12-30 DIAGNOSIS — Z01419 Encounter for gynecological examination (general) (routine) without abnormal findings: Secondary | ICD-10-CM | POA: Diagnosis not present

## 2017-12-30 NOTE — Progress Notes (Addendum)
67 y.o. G1P1 Married  Caucasian Fe here for annual exam. Menopausal no HRT. Denies vaginal dryness or vaginal bleeding. Sees PCP Darcus Austin for aex, labs, cholesterol and asthma medication.Marland Kitchen Has been exercising now after tearing achilles tendon, did PT and now back to full force exercise and playing tennis. Occasional lichen sclerosis flares in last year, Clobetasol working well. Not sure when last BMD was done at [facility], will request record. Sad she will never had grandchildren due to children's decisions. Plans to enjoy others. No other health issues today.  No LMP recorded. Patient has had a hysterectomy.          Sexually active: Yes.    The current method of family planning is post menopausal status.    Exercising: Yes.    Gym/ health club routine includes cardio, yoga and tennis. Smoker:  no  Health Maintenance: Pap:  1-13 neg History of Abnormal Pap: yes MMG:  04-22-16 category b density birads 2:neg Self Breast exams: sometimes Colonoscopy:  2007 neg given cologard information BMD:   7/12 Osteopenia of spine TDaP:  2015 Shingles: had done Pneumonia: had done Hep C and HIV: with PCP Labs: PCP   reports that she has never smoked. She has never used smokeless tobacco. She reports that she drinks about 0.6 oz of alcohol per week. She reports that she does not use drugs.  Past Medical History:  Diagnosis Date  . Asthma   . Endometriosis    resolved by TAH  . Hyperlipidemia   . Rectal fissure 12/97    Past Surgical History:  Procedure Laterality Date  . ABDOMINAL HYSTERECTOMY    . TONSILLECTOMY AND ADENOIDECTOMY  1961    Current Outpatient Medications  Medication Sig Dispense Refill  . ADVAIR DISKUS 100-50 MCG/DOSE AEPB     . ALPRAZolam (XANAX) 0.5 MG tablet Take 0.5 mg by mouth at bedtime as needed. To help sleep     . Cetirizine HCl (ZYRTEC PO) Take by mouth as needed.    . clobetasol cream (TEMOVATE) 0.05 % APPLY THINLY TO AFFECTED AREA DAILY FOR 2 WEEKS ATONSET OF  FLAIR 60 g 0  . Cyanocobalamin (VITAMIN B-12 PO) Take 1 tablet by mouth as needed.     . diclofenac sodium (VOLTAREN) 1 % GEL Apply 2 g topically as directed. 300 Tube 5  . esomeprazole (NEXIUM) 40 MG capsule Take 20 mg by mouth daily before breakfast. Reported on 12/04/2015    . montelukast (SINGULAIR) 10 MG tablet Take 10 mg by mouth at bedtime.      . Multiple Vitamins-Minerals (MULTIVITAMINS THER. W/MINERALS) TABS Take 1 tablet by mouth daily.      . NEOMYCIN-POLYMYXIN-HYDROCORTISONE (CORTISPORIN) 1 % SOLN OTIC solution Apply 1-2 drops to toe BID after soaking 10 mL 1  . Omega-3 Fatty Acids (FISH OIL PO) Take 1 capsule by mouth daily.       No current facility-administered medications for this visit.     Family History  Problem Relation Age of Onset  . Breast cancer Mother 5    ROS:  Pertinent items are noted in HPI.  Otherwise, a comprehensive ROS was negative.  Exam:   There were no vitals taken for this visit.   Ht Readings from Last 3 Encounters:  12/17/16 5' 0.75" (1.543 m)  12/03/16 5\' 1"  (1.549 m)  11/27/16 5\' 1"  (1.549 m)    General appearance: alert, cooperative and appears stated age Head: Normocephalic, without obvious abnormality, atraumatic Neck: no adenopathy, supple, symmetrical, trachea midline  and thyroid normal to inspection and palpation Lungs: clear to auscultation bilaterally Breasts: normal appearance, no masses or tenderness, No nipple retraction or dimpling, No nipple discharge or bleeding, No axillary or supraclavicular adenopathy Heart: regular rate and rhythm Abdomen: soft, non-tender; no masses,  no organomegaly Extremities: extremities normal, atraumatic, no cyanosis or edema Skin: Skin color, texture, turgor normal. No rashes or lesions Lymph nodes: Cervical, supraclavicular, and axillary nodes normal. No abnormal inguinal nodes palpated Neurologic: Grossly normal   Pelvic: External genitalia:  no lesions, normal female, no LS changes or  flare noted              Urethra:  normal appearing urethra with no masses, tenderness or lesions              Bartholin's and Skene's: normal                 Vagina: normal appearing vagina with normal color and discharge, no lesions              Cervix: absent              Pap taken: No. Bimanual Exam:  Uterus:  uterus absent              Adnexa: no mass, fullness, tenderness               Rectovaginal: Confirms               Anus:  normal sphincter tone, no lesions  Chaperone present: yes  A:  Well Woman with normal exam  Menopausal no HRT, S/P TAH with BSO  History of Lichen Sclerosis, occasional flares in last year, Clobetasol working well  Cholesterol, asthma,allergy management with PCP, all stable per patient  Colonoscopy due declines, declines IFOB, will consider Cologard and advise  Family history of breast cancer, mother age 39  P:   Reviewed health and wellness pertinent to exam  Patient aware to advise if has problem with LS control or new areas. Does not need Rx at this time.  Continue follow up with PCP as indicated.  BMD records requested   Given Cologard information   Stressed SBE and mammogram yearly  Pap smear: no   counseled on breast self exam, mammography screening, feminine hygiene, adequate intake of calcium and vitamin D, diet and exercise  return annually or prn  An After Visit Summary was printed and given to the patient.

## 2017-12-31 ENCOUNTER — Telehealth: Payer: Self-pay

## 2017-12-31 NOTE — Telephone Encounter (Signed)
Pt notified that her last bone density was 7/12 & it showed osteopenia. Pt was told that she is due for her bone density & mmg. Pt agrees to have them done. Pt is aware of importance of having yearly mammograms & a bone density done now.

## 2018-01-01 ENCOUNTER — Telehealth: Payer: Self-pay | Admitting: Certified Nurse Midwife

## 2018-01-01 NOTE — Telephone Encounter (Signed)
Patient left message on answering machine requesting an order for bone density to be faxed over to [facility].

## 2018-01-01 NOTE — Telephone Encounter (Signed)
Order to Melvia Heaps CNM for review and signature.

## 2018-01-04 ENCOUNTER — Encounter: Payer: Self-pay | Admitting: Certified Nurse Midwife

## 2018-01-06 NOTE — Telephone Encounter (Signed)
Advised patient order for BMD has been faxed to [facility] and patient may schedule at her convenience. Patient is agreeable. Encounter closed.

## 2018-03-22 DIAGNOSIS — Z0000 Encounter for general adult medical examination without abnormal findings: Secondary | ICD-10-CM | POA: Diagnosis not present

## 2018-03-22 DIAGNOSIS — M8580 Other specified disorders of bone density and structure, unspecified site: Secondary | ICD-10-CM | POA: Diagnosis not present

## 2018-03-22 DIAGNOSIS — E785 Hyperlipidemia, unspecified: Secondary | ICD-10-CM | POA: Diagnosis not present

## 2018-03-22 DIAGNOSIS — Z1159 Encounter for screening for other viral diseases: Secondary | ICD-10-CM | POA: Diagnosis not present

## 2018-03-22 DIAGNOSIS — R7303 Prediabetes: Secondary | ICD-10-CM | POA: Diagnosis not present

## 2018-03-22 DIAGNOSIS — J4520 Mild intermittent asthma, uncomplicated: Secondary | ICD-10-CM | POA: Diagnosis not present

## 2018-03-22 DIAGNOSIS — E669 Obesity, unspecified: Secondary | ICD-10-CM | POA: Diagnosis not present

## 2018-04-19 DIAGNOSIS — L608 Other nail disorders: Secondary | ICD-10-CM | POA: Diagnosis not present

## 2018-04-20 DIAGNOSIS — Z1231 Encounter for screening mammogram for malignant neoplasm of breast: Secondary | ICD-10-CM | POA: Diagnosis not present

## 2018-04-20 DIAGNOSIS — M8588 Other specified disorders of bone density and structure, other site: Secondary | ICD-10-CM | POA: Diagnosis not present

## 2018-04-20 DIAGNOSIS — Z803 Family history of malignant neoplasm of breast: Secondary | ICD-10-CM | POA: Diagnosis not present

## 2018-04-22 ENCOUNTER — Telehealth: Payer: Self-pay

## 2018-04-22 NOTE — Telephone Encounter (Signed)
Pt notified of bmd results. See scanned in results.

## 2018-06-30 DIAGNOSIS — Z23 Encounter for immunization: Secondary | ICD-10-CM | POA: Diagnosis not present

## 2018-07-14 DIAGNOSIS — J988 Other specified respiratory disorders: Secondary | ICD-10-CM | POA: Diagnosis not present

## 2018-07-14 DIAGNOSIS — J4520 Mild intermittent asthma, uncomplicated: Secondary | ICD-10-CM | POA: Diagnosis not present

## 2018-08-16 DIAGNOSIS — H5203 Hypermetropia, bilateral: Secondary | ICD-10-CM | POA: Diagnosis not present

## 2018-08-16 DIAGNOSIS — H18603 Keratoconus, unspecified, bilateral: Secondary | ICD-10-CM | POA: Diagnosis not present

## 2018-08-16 DIAGNOSIS — H52203 Unspecified astigmatism, bilateral: Secondary | ICD-10-CM | POA: Diagnosis not present

## 2018-08-19 ENCOUNTER — Telehealth: Payer: Self-pay | Admitting: Certified Nurse Midwife

## 2018-08-19 ENCOUNTER — Encounter: Payer: Self-pay | Admitting: Certified Nurse Midwife

## 2018-08-19 ENCOUNTER — Other Ambulatory Visit: Payer: Self-pay

## 2018-08-19 ENCOUNTER — Ambulatory Visit (INDEPENDENT_AMBULATORY_CARE_PROVIDER_SITE_OTHER): Payer: Medicare Other | Admitting: Certified Nurse Midwife

## 2018-08-19 VITALS — BP 120/70 | HR 70 | Resp 16 | Wt 156.0 lb

## 2018-08-19 DIAGNOSIS — L900 Lichen sclerosus et atrophicus: Secondary | ICD-10-CM | POA: Diagnosis not present

## 2018-08-19 DIAGNOSIS — N6310 Unspecified lump in the right breast, unspecified quadrant: Secondary | ICD-10-CM | POA: Diagnosis not present

## 2018-08-19 MED ORDER — CLOBETASOL PROPIONATE 0.05 % EX CREA: TOPICAL_CREAM | CUTANEOUS | 1 refills | Status: DC

## 2018-08-19 NOTE — Patient Instructions (Signed)
Lichen Sclerosus Lichen sclerosus is a skin problem. It can happen on any part of the body. It happens most often in the anal or genital areas. It can cause itching and discomfort. Treatment can help to control symptoms. This skin problem is not passed from one person to another (not contagious). The cause is not known. Follow these instructions at home:  Take over-the-counter and prescription medicines only as told by your doctor.  Use creams or ointments as told by your doctor.  Do not scratch the affected areas of skin.  Women should keep the vagina as clean and dry as they can.  Keep all follow-up visits as told by your doctor. This is important. Contact a doctor if:  Your redness, swelling, or pain gets worse.  You have fluid, blood, or pus coming from the area.  You have new patches (lesions) on your skin.  You have a fever.  You have pain during sex. This information is not intended to replace advice given to you by your health care provider. Make sure you discuss any questions you have with your health care provider. Document Released: 09/04/2008 Document Revised: 02/28/2016 Document Reviewed: 12/18/2014 Elsevier Interactive Patient Education  2018 Elsevier Inc.  

## 2018-08-19 NOTE — Telephone Encounter (Signed)
Patient found a breast lump and called [facility] to schedule an appointment. She was told to call our office to have an order sent.

## 2018-08-19 NOTE — Telephone Encounter (Signed)
Spoke with patient. Patient reports right breast lump and bruising,  outer part of breast where bra strap starts. Patient noticed 11/7 after deep tissue massage. Denies nipple d/c, redness or pain. Last MMG at [facility] 04/20/18, negative.   Patient states she also has been treating for possible recurrent yeast. Denies any current symptoms, would like to discuss.   OV scheduled for today at 11am with Melvia Heaps, CNM.  Patient verbalizes understanding.   Encounter closed.

## 2018-08-19 NOTE — Progress Notes (Signed)
Patient is scheduled for R  Breast Diagnostic Mammogram and R Breast Ultrasound at [facility] imaging on 08/31/18 at 2:00 . Patient agreeable to time/date/location. Called Jessica at [facility] and left a message to call patient directly if any earlier appointments open up. Pt aware.

## 2018-08-19 NOTE — Progress Notes (Signed)
  Subjective:     Patient ID: Christy Rojas, female   DOB: 1951-07-14, 67 y.o.   MRN: [id number]  67 yo g1p1001 here with complaint of right breast lump to occur after deep tissue massage one week ago. She also noted some bruising around in axillary area. Denies nipple discharge. Does SBE and had not noted. Vaginal tenderness and itching and cracked skin in old "episiotomy site". Please check. Denies vaginal bleeding, but vaginal dryness using coconut oil. History of Lichen Sclerosis, no flares in a while. No other health issues today.    Review of Systems  Constitutional: Negative.   HENT: Negative.   Eyes: Negative.   Respiratory: Negative.   Cardiovascular: Negative.        Breast lump and bruise on breast/chest wall on right  Gastrointestinal: Negative.   Endocrine: Negative.   Genitourinary: Negative for pelvic pain, vaginal bleeding, vaginal discharge and vaginal pain.       External vaginal dryness with itching  Musculoskeletal: Negative.   Skin: Negative.   Allergic/Immunologic: Negative.   Hematological: Negative.   Psychiatric/Behavioral: Negative.        Objective:   Physical Exam  Constitutional: She is oriented to person, place, and time. She appears well-developed and well-nourished.  Pulmonary/Chest: Right breast exhibits mass. Right breast exhibits no nipple discharge, no skin change and no tenderness. Left breast exhibits no nipple discharge, no skin change and no tenderness. No breast swelling, tenderness, discharge or bleeding.    Genitourinary: Rectum normal.    Pelvic exam was performed with patient supine. There is no rash, tenderness or lesion on the right labia. There is no rash, tenderness or lesion on the left labia.  Genitourinary Comments: Vulva area examined with Lichen Sclerosis flare noted below introital area and at Clitoral area. Shown to patient in mirror  Lymphadenopathy:    She has no axillary adenopathy. No inguinal adenopathy noted on the  right or left side.       Right: No inguinal adenopathy present.       Left: No inguinal adenopathy present.  Neurological: She is alert and oriented to person, place, and time.  Skin: Skin is warm and dry.  Psychiatric: She has a normal mood and affect. Her speech is normal and behavior is normal. Judgment and thought content normal. Cognition and memory are normal.       Assessment:     Right breast masses ? 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 Needs diagnostic mammogram and [place] to make sure no issues. Patient agreeable. She will be scheduled prior to leaving today. Discussed vaginal finding of Lichen Sclerosis flare and instructed to start with Clobetasol use twice daily for 1 weeks and once daily for 1 weeks to see if this will resolve. If symptoms have stopped no repeat OV needed. If not needs to come in for recheck. Questions addressed.   Rv prn

## 2018-08-24 DIAGNOSIS — N6311 Unspecified lump in the right breast, upper outer quadrant: Secondary | ICD-10-CM | POA: Diagnosis not present

## 2018-08-24 DIAGNOSIS — S2001XA Contusion of right breast, initial encounter: Secondary | ICD-10-CM | POA: Diagnosis not present

## 2018-08-24 DIAGNOSIS — N641 Fat necrosis of breast: Secondary | ICD-10-CM | POA: Diagnosis not present

## 2018-10-06 DIAGNOSIS — N904 Leukoplakia of vulva: Secondary | ICD-10-CM

## 2018-10-06 HISTORY — DX: Leukoplakia of vulva: N90.4

## 2018-11-01 DIAGNOSIS — L814 Other melanin hyperpigmentation: Secondary | ICD-10-CM | POA: Diagnosis not present

## 2018-11-01 DIAGNOSIS — D1801 Hemangioma of skin and subcutaneous tissue: Secondary | ICD-10-CM | POA: Diagnosis not present

## 2018-11-01 DIAGNOSIS — D225 Melanocytic nevi of trunk: Secondary | ICD-10-CM | POA: Diagnosis not present

## 2018-11-01 DIAGNOSIS — L821 Other seborrheic keratosis: Secondary | ICD-10-CM | POA: Diagnosis not present

## 2018-11-01 DIAGNOSIS — L738 Other specified follicular disorders: Secondary | ICD-10-CM | POA: Diagnosis not present

## 2018-11-01 DIAGNOSIS — L301 Dyshidrosis [pompholyx]: Secondary | ICD-10-CM | POA: Diagnosis not present

## 2018-11-30 ENCOUNTER — Telehealth: Payer: Self-pay | Admitting: *Deleted

## 2018-11-30 NOTE — Telephone Encounter (Signed)
PA approval received via fax for clobetasol cream, valid 09/01/18 -2/24/201.  Call returned to patient, advised as seen above. Patient requesting new Rx for clobetasol. Advised patient RX for Clobetasol cream #60g/1RF [facility] [place] on [facility]18. Patient states she never had Rx filled. Instructed patient to f/u with pharmacy for filling, return call to office if any additional assistance needed. Patient verbalizes understanding.  Copy of approval to scan.   Encounter closed.

## 2018-11-30 NOTE — Telephone Encounter (Signed)
Patient left a message on the answering machine stating that her Clobetasol will be covered and to please go ahead and [facility]. She asked if you will call and let her know when it has been phoned in?

## 2018-11-30 NOTE — Telephone Encounter (Signed)
PA request received from [facility] for Clobetasol Cream. Patient notified of PA, desires to proceed. Patient is aware our office will notify of response once received, can take up to 5 business days. Confirmed with patient no alternative Rx has been used. Has tried OTC coconut oil, olive oil and A&D ointment.    PA completed and faxed to [place]

## 2018-12-01 ENCOUNTER — Telehealth: Payer: Self-pay | Admitting: Certified Nurse Midwife

## 2018-12-01 DIAGNOSIS — L900 Lichen sclerosus et atrophicus: Secondary | ICD-10-CM

## 2018-12-01 MED ORDER — CLOBETASOL PROPIONATE 0.05 % EX CREA: TOPICAL_CREAM | CUTANEOUS | 1 refills | Status: DC

## 2018-12-01 NOTE — Telephone Encounter (Signed)
Spoke with Sherren Mocha at [facility]. Was advised no Clobetasol cream Rx on file, sent on 08/21/18. Advised will place new RX for clobetasol cream.   [facility]ol cream #60g[facility]RF [facility]

## 2018-12-01 NOTE — Telephone Encounter (Signed)
[facility] [place] this morning and was told the prescription was sent to pharmacy.  Patient said told her to have our office contact the phramcy for approval. She asked if someone would let her know when this has been done.

## 2018-12-01 NOTE — Telephone Encounter (Signed)
Left message, ok per dpr, name identified on voicemail. Advised new clobetasol [facility] has [facility], spoke with Sherren Mocha. F/u with pharmacy for filling. Return call to office if any additional questions/concerns.   Routing to Melvia Heaps, CNM  Encounter closed.

## 2018-12-06 ENCOUNTER — Encounter: Payer: Self-pay | Admitting: Certified Nurse Midwife

## 2019-01-04 ENCOUNTER — Ambulatory Visit: Payer: PRIVATE HEALTH INSURANCE | Admitting: Certified Nurse Midwife

## 2019-02-24 ENCOUNTER — Other Ambulatory Visit: Payer: Self-pay

## 2019-03-01 ENCOUNTER — Encounter: Payer: Self-pay | Admitting: Obstetrics and Gynecology

## 2019-03-01 ENCOUNTER — Ambulatory Visit (INDEPENDENT_AMBULATORY_CARE_PROVIDER_SITE_OTHER): Payer: Medicare Other | Admitting: Obstetrics and Gynecology

## 2019-03-01 ENCOUNTER — Other Ambulatory Visit: Payer: Self-pay

## 2019-03-01 VITALS — BP 100/60 | HR 76 | Temp 97.5°F | Resp 12 | Ht 61.0 in | Wt 161.2 lb

## 2019-03-01 DIAGNOSIS — Z01419 Encounter for gynecological examination (general) (routine) without abnormal findings: Secondary | ICD-10-CM | POA: Diagnosis not present

## 2019-03-01 DIAGNOSIS — Z124 Encounter for screening for malignant neoplasm of cervix: Secondary | ICD-10-CM

## 2019-03-01 NOTE — Progress Notes (Signed)
68 y.o. G1P1 Married Caucasian female here for annual exam.    Has deep tissue massage and noted lumps in her right breast area. She was dx with fatty necrosis. She is due in July 2020.   Using clobetasol cream for lichen sclerosus.  Uses it twice a year.  Does not need refill.  No prior biopsy.   Labs with PCP.   PCP:  London Pepper, MD   No LMP recorded. Patient has had a hysterectomy.           Sexually active: No.  The current method of family planning is post menopausal status and hysterectomy.    Exercising: Yes.    yoga, walking, bike riding Smoker:  no  Health Maintenance: Pap:  1/13 Negative History of abnormal Pap:  yes MMG:  08/24/18 Right Breast Diagnostic MM and [place] -- BIRADS 2 benign/density b Colonoscopy:  2007 negative; given cologard information.  States she did this in the last 1 - 2 years.  BMD:   04/20/18  Result  Normal  TDaP:  2015 Gardasil:   n/a HIV and Hep C: PCP Screening Labs: PCP   reports that she has never smoked. She has never used smokeless tobacco. She reports current alcohol use of about 1.0 standard drinks of alcohol per week. She reports that she does not use drugs.  Past Medical History:  Diagnosis Date  . Asthma   . Endometriosis    resolved by TAH  . Hyperlipidemia   . Rectal fissure 12/97    Past Surgical History:  Procedure Laterality Date  . ABDOMINAL HYSTERECTOMY    . TONSILLECTOMY AND ADENOIDECTOMY  1961    Current Outpatient Medications  Medication Sig Dispense Refill  . ADVAIR DISKUS 100-50 MCG/DOSE AEPB     . Cetirizine HCl (ZYRTEC PO) Take by mouth as needed.    . clobetasol cream (TEMOVATE) 0.05 % Apply thinly to affected area twice daily x 1 week and then once daily x 1 week 60 g 1  . Cyanocobalamin (VITAMIN B-12 PO) Take 1 tablet by mouth as needed.     . diclofenac sodium (VOLTAREN) 1 % GEL Apply 2 g topically as directed. 300 Tube 5  . esomeprazole (NEXIUM) 40 MG capsule Take 20 mg by mouth daily before  breakfast. Reported on 12/04/2015    . fexofenadine (ALLEGRA) 180 MG tablet Take 180 mg by mouth daily.    . mometasone (NASONEX) 50 MCG/ACT nasal spray Place 2 sprays into the nose daily.    . montelukast (SINGULAIR) 10 MG tablet Take 10 mg by mouth at bedtime.      . Multiple Vitamins-Minerals (MULTIVITAMINS THER. W/MINERALS) TABS Take 1 tablet by mouth daily.      . Omega-3 Fatty Acids (FISH OIL PO) Take 1 capsule by mouth daily.      Marland Kitchen triamcinolone cream (KENALOG) 0.1 %     . UNABLE TO FIND flovent inhaler     No current facility-administered medications for this visit.     Family History  Problem Relation Age of Onset  . Breast cancer Mother 21    Review of Systems  Constitutional: Negative.   HENT: Negative.   Eyes: Negative.   Respiratory: Negative.   Cardiovascular: Negative.   Gastrointestinal: Negative.   Endocrine: Negative.   Genitourinary: Negative.   Musculoskeletal: Negative.   Skin: Negative.   Allergic/Immunologic: Negative.   Neurological: Negative.   Hematological: Negative.   Psychiatric/Behavioral: Negative.     Exam:   BP 100/60 (BP  Location: Right Arm, Patient Position: Sitting, Cuff Size: Normal)   Pulse 76   Temp (!) 97.5 F (36.4 C) (Temporal)   Resp 12   Ht 5\' 1"  (1.549 m)   Wt 161 lb 3.2 oz (73.1 kg)   BMI 30.46 kg/m     General appearance: alert, cooperative and appears stated age Head: Normocephalic, without obvious abnormality, atraumatic Neck: no adenopathy, supple, symmetrical, trachea midline and thyroid normal to inspection and palpation Lungs: clear to auscultation bilaterally Breasts: normal appearance, no masses or tenderness, No nipple retraction or dimpling, No nipple discharge or bleeding, No axillary or supraclavicular adenopathy Heart: regular rate and rhythm Abdomen: soft, non-tender; no masses, no organomegaly Extremities: extremities normal, atraumatic, no cyanosis or edema Skin: Skin color, texture, turgor normal. No  rashes or lesions Lymph nodes: Cervical, supraclavicular, and axillary nodes normal. No abnormal inguinal nodes palpated Neurologic: Grossly normal  Pelvic: External genitalia:   Hypopigmentation of supra-clitoral region and perineum.               Urethra:  normal appearing urethra with no masses, tenderness or lesions              Bartholins and Skenes: normal                 Vagina: normal appearing vagina with normal color and discharge, no lesions              Cervix: absent              Pap taken: No. Bimanual Exam:  Uterus:  absent              Adnexa: no mass, fullness, tenderness              Rectal exam: Yes.  .  Confirms.              Anus:  normal sphincter tone, no lesions  Chaperone was present for exam.  Assessment:   Well woman visit with normal exam. Status post TAH/BSO  for endometriosis.  FH breast cancer in patient's mother in her 83s.  Hx lichen sclerosus.   Plan: Mammogram screening.  She will schedule.  Recommended self breast awareness. Pap and HR HPV as above. Guidelines for Calcium, Vitamin D, regular exercise program including cardiovascular and weight bearing exercise. Will get a copy of most recent colonoscopy - Dr. Cristina Gong.  Ok to use Clobetasol cream twice daily for 2 weeks at a time as needed for flare of  Follow up annually and prn.   After visit summary provided.

## 2019-03-01 NOTE — Patient Instructions (Signed)

## 2019-03-03 NOTE — Progress Notes (Signed)
GYNECOLOGY  VISIT   HPI: 68 y.o.   Married  Caucasian  female   G1P1 with No LMP recorded. Patient has had a hysterectomy.   here for breast exam only.  She does not remember me doing her breast exam at her visit on 03/01/19 at her annual exam. She only remembers me doing her axillary exam.    FH breast cancer in mother in her 67s.  GYNECOLOGIC HISTORY: No LMP recorded. Patient has had a hysterectomy. Contraception:  Hysterectomy Menopausal hormone therapy:  none Last mammogram:  08/24/18 Right Breast Diagnostic MM and [place] -- BIRADS 2 benign/density b.  Has her mammogram for July, 2020.  Last pap smear:    1/13 Negative        OB History    Gravida  1   Para  1   Term      Preterm      AB      Living  1     SAB      TAB      Ectopic      Multiple      Live Births                 Patient Active Problem List   Diagnosis Date Noted  . Pain of right heel 09/25/2016  . Acquired hammer toes of both feet 04/14/2014  . Pain in joint, ankle and foot 03/23/2014  . Lichen sclerosus of female genitalia 10/25/2013  . Chest pain 08/01/2013    Past Medical History:  Diagnosis Date  . Asthma   . Endometriosis    resolved by TAH  . Hyperlipidemia   . Rectal fissure 12/97    Past Surgical History:  Procedure Laterality Date  . ABDOMINAL HYSTERECTOMY    . TONSILLECTOMY AND ADENOIDECTOMY  1961    Current Outpatient Medications  Medication Sig Dispense Refill  . ADVAIR DISKUS 100-50 MCG/DOSE AEPB     . Cetirizine HCl (ZYRTEC PO) Take by mouth as needed.    . clobetasol cream (TEMOVATE) 0.05 % Apply thinly to affected area twice daily x 1 week and then once daily x 1 week 60 g 1  . Cyanocobalamin (VITAMIN B-12 PO) Take 1 tablet by mouth as needed.     . diclofenac sodium (VOLTAREN) 1 % GEL Apply 2 g topically as directed. 300 Tube 5  . esomeprazole (NEXIUM) 40 MG capsule Take 20 mg by mouth daily before breakfast. Reported on 12/04/2015    . fexofenadine  (ALLEGRA) 180 MG tablet Take 180 mg by mouth daily.    . mometasone (NASONEX) 50 MCG/ACT nasal spray Place 2 sprays into the nose daily.    . montelukast (SINGULAIR) 10 MG tablet Take 10 mg by mouth at bedtime.      . Multiple Vitamins-Minerals (MULTIVITAMINS THER. W/MINERALS) TABS Take 1 tablet by mouth daily.      . Omega-3 Fatty Acids (FISH OIL PO) Take 1 capsule by mouth daily.      Marland Kitchen triamcinolone cream (KENALOG) 0.1 %     . UNABLE TO FIND flovent inhaler     No current facility-administered medications for this visit.      ALLERGIES: Other and Latex  Family History  Problem Relation Age of Onset  . Breast cancer Mother 78    Social History   Socioeconomic History  . Marital status: Married    Spouse name: Not on file  . Number of children: Not on file  . Years of education:  Not on file  . Highest education level: Not on file  Occupational History  . Not on file  Social Needs  . Financial resource strain: Not on file  . Food insecurity:    Worry: Not on file    Inability: Not on file  . Transportation needs:    Medical: Not on file    Non-medical: Not on file  Tobacco Use  . Smoking status: Never Smoker  . Smokeless tobacco: Never Used  Substance and Sexual Activity  . Alcohol use: Yes    Alcohol/week: 1.0 standard drinks    Types: 1 Standard drinks or equivalent per week  . Drug use: Never  . Sexual activity: Not Currently    Partners: Male    Birth control/protection: Surgical    Comment: hysterectomy  Lifestyle  . Physical activity:    Days per week: Not on file    Minutes per session: Not on file  . Stress: Not on file  Relationships  . Social connections:    Talks on phone: Not on file    Gets together: Not on file    Attends religious service: Not on file    Active member of club or organization: Not on file    Attends meetings of clubs or organizations: Not on file    Relationship status: Not on file  . Intimate partner violence:    Fear of  current or ex partner: Not on file    Emotionally abused: Not on file    Physically abused: Not on file    Forced sexual activity: Not on file  Other Topics Concern  . Not on file  Social History Narrative  . Not on file    Review of Systems  Constitutional: Negative.   HENT: Negative.   Eyes: Negative.   Respiratory: Negative.   Cardiovascular: Negative.   Gastrointestinal: Negative.   Endocrine: Negative.   Genitourinary: Negative.   Musculoskeletal: Negative.   Skin: Negative.   Allergic/Immunologic: Negative.   Neurological: Negative.   Hematological: Negative.   Psychiatric/Behavioral: Negative.     PHYSICAL EXAMINATION:    BP 116/78 (BP Location: Left Arm, Patient Position: Sitting, Cuff Size: Normal)   Pulse 68   Temp (!) 97.4 F (36.3 C) (Temporal)   Resp 12   Ht 5\' 1"  (1.549 m)   Wt 161 lb (73 kg)   BMI 30.42 kg/m     General appearance: alert, cooperative and appears stated age   Breasts: normal appearance, no masses or tenderness, No nipple retraction or dimpling, No nipple discharge or bleeding, No axillary or supraclavicular adenopathy   Chaperone was present for exam.  ASSESSMENT  Normal breast exam.  FH of breast cancer in her mother.  Hx fatty necrosis of right breast.   PLAN  Reassurance regarding normal breast exam.  Complete mammogram.  FU prn.    An After Visit Summary was printed and given to the patient.

## 2019-03-04 ENCOUNTER — Other Ambulatory Visit: Payer: Self-pay

## 2019-03-04 ENCOUNTER — Ambulatory Visit (INDEPENDENT_AMBULATORY_CARE_PROVIDER_SITE_OTHER): Payer: Medicare Other | Admitting: Obstetrics and Gynecology

## 2019-03-04 ENCOUNTER — Encounter: Payer: Self-pay | Admitting: Obstetrics and Gynecology

## 2019-03-04 VITALS — BP 116/78 | HR 68 | Temp 97.4°F | Resp 12 | Ht 61.0 in | Wt 161.0 lb

## 2019-03-04 DIAGNOSIS — Z0000 Encounter for general adult medical examination without abnormal findings: Secondary | ICD-10-CM

## 2019-04-07 ENCOUNTER — Telehealth: Payer: Self-pay | Admitting: Obstetrics and Gynecology

## 2019-04-07 DIAGNOSIS — L900 Lichen sclerosus et atrophicus: Secondary | ICD-10-CM

## 2019-04-07 NOTE — Telephone Encounter (Signed)
Patient is concerned with anal itching and has questions about lichen sclerosus.

## 2019-04-07 NOTE — Telephone Encounter (Signed)
Called patient. She has lots of questions regarding lichen sclerosus. She states she seems to have vaginal itching more often and shortly after she stops using Clobestasol, her itching returns. Patient now with some peri-anal itching at times. She's concerned about cancer or something being missed and would like to come in for a biopsy. Will discuss with Dr.Silva(pt. request Dr.Silva) and call back to schedule if that is her recommendation. Routed to provider.

## 2019-04-11 ENCOUNTER — Telehealth: Payer: Self-pay | Admitting: Obstetrics and Gynecology

## 2019-04-11 ENCOUNTER — Other Ambulatory Visit: Payer: Self-pay | Admitting: Obstetrics and Gynecology

## 2019-04-11 DIAGNOSIS — R239 Unspecified skin changes: Secondary | ICD-10-CM

## 2019-04-11 NOTE — Telephone Encounter (Signed)
Call to patient. Advised of recommendation from Dr Quincy Simmonds.  Appointment scheduled for 04-13-19.  Encounter closed.

## 2019-04-11 NOTE — Telephone Encounter (Signed)
Ok to have patient return for a biopsy.  I noted some skin changes of the perineal area that looked consistent with lichen sclerosus.  I will place an order and this will be sent to precert.

## 2019-04-11 NOTE — Telephone Encounter (Signed)
Call placed to patient to review benefits for scheduled appointment on 04/14/2019. Left voicemail message requesting a return call

## 2019-04-11 NOTE — Progress Notes (Signed)
Order placed for vulvar biopsy.  Has hx lichen sclerosus and uses clobetasol cream.

## 2019-04-12 ENCOUNTER — Telehealth: Payer: Self-pay | Admitting: Obstetrics and Gynecology

## 2019-04-12 NOTE — Telephone Encounter (Signed)
Patient returning call to Suzy. °

## 2019-04-12 NOTE — Telephone Encounter (Signed)
Spoke with patient  regarding benefit for scheduled procedure. Patient understood and agreeable. Patient scheduled  04/14/2019 with Dr Quincy Simmonds. Patient aware of appointment date, arrival time and cancellation fee. No further questions. Will close encounter

## 2019-04-12 NOTE — Telephone Encounter (Signed)
Patient would like to know if she can use coconut oil vaginal cleanse before procedure tomorrow.

## 2019-04-12 NOTE — Telephone Encounter (Signed)
Call to patient. Patient scheduled for vulvar biopsy on 04-14-2019. Patient states she doesn't have to use the coconut oil, but wondering if she should. RN advised if not necessary to avoid using coconut oil so there is no interference with biopsy sample. Patient verbalized understanding and agreeable.   Routing to provider and will close encounter.

## 2019-04-14 ENCOUNTER — Encounter: Payer: Self-pay | Admitting: Obstetrics and Gynecology

## 2019-04-14 ENCOUNTER — Other Ambulatory Visit: Payer: Self-pay

## 2019-04-14 ENCOUNTER — Ambulatory Visit: Payer: Medicare Other | Admitting: Obstetrics and Gynecology

## 2019-04-14 VITALS — BP 114/58 | HR 68 | Temp 97.2°F | Ht 61.0 in | Wt 155.0 lb

## 2019-04-14 DIAGNOSIS — L900 Lichen sclerosus et atrophicus: Secondary | ICD-10-CM | POA: Diagnosis not present

## 2019-04-14 DIAGNOSIS — R239 Unspecified skin changes: Secondary | ICD-10-CM | POA: Diagnosis not present

## 2019-04-14 DIAGNOSIS — N9089 Other specified noninflammatory disorders of vulva and perineum: Secondary | ICD-10-CM | POA: Diagnosis not present

## 2019-04-14 NOTE — Patient Instructions (Signed)
Lichen Sclerosus Lichen sclerosus is a skin problem. It can happen on any part of the body, but it commonly involves the anal or genital areas. It can cause itching and discomfort in these areas. Treatment can help to control symptoms. When the genital area is affected, getting treatment is important because the condition can cause scarring that may lead to other problems. What are the causes? The cause of this condition is not known. It may be related to an overactive immune system or a lack of certain hormones. Lichen sclerosus is not an infection or a fungus, and it is not passed from one person to another (not contagious). What increases the risk? This condition is more likely to develop in women, usually after menopause. What are the signs or symptoms? Symptoms of this condition include:  Thin, wrinkled, white areas on the skin.  Thickened white areas on the skin.  Red and swollen patches (lesions) on the skin.  Tears or cracks in the skin.  Bruising.  Blood blisters.  Severe itching.  Pain, itching, or burning when urinating. Constipation is also common in people with lichen sclerosus. How is this diagnosed? This condition may be diagnosed with a physical exam. In some cases, a tissue sample (biopsy sample) may be removed to be looked at under a microscope. How is this treated? This condition is usually treated with medicated creams or ointments (topical steroids) that are applied over the affected areas. In some cases, treatment may also include medicines that are taken by mouth. Surgery may be needed in more severe cases that are causing problems such as scarring. Follow these instructions at home:  Take or use over-the-counter and prescription medicines only as told by your health care provider.  Use creams or ointments as told by your health care provider.  Do not scratch the affected areas of skin.  If you are a woman, be sure to keep the vaginal area as clean and dry  as possible.  Clean the affected area of skin gently with water. Avoid using rough towels or toilet paper.  Keep all follow-up visits as told by your health care provider. This is important. Contact a health care provider if:  You have increasing redness, swelling, or pain in the affected area.  You have fluid, blood, or pus coming from the affected area.  You have new lesions on your skin.  You have a fever.  You have pain during sex. Summary  Lichen sclerosus is a skin problem. When the genital area is affected, getting treatment is important because the condition can cause scarring that may lead to other problems.  This condition is usually treated with medicated creams or ointments (topical steroids) that are applied over the affected areas.  Take or use over-the-counter and prescription medicines only as told by your health care provider.  Contact a health care provider if you have new lesions on your skin, have pain during sex, or have increasing redness, swelling, or pain in the affected area.  Keep all follow-up visits as told by your health care provider. This is important. This information is not intended to replace advice given to you by your health care provider. Make sure you discuss any questions you have with your health care provider. Document Released: 02/12/2011 Document Revised: 02/04/2018 Document Reviewed: 02/04/2018 Elsevier Patient Education  2020 [employer] After This sheet gives you information about how to care for yourself after your procedure. Your health care provider may also give you more  specific instructions. If you have problems or questions, contact your health care provider. What can I expect after the procedure? After the procedure, it is common to have:  Slight bleeding from the biopsy site.  Discomfort at the biopsy site. Follow these instructions at home: Biopsy site care   Follow instructions from your health  care provider about how to take care of your biopsy site. Make sure you: ? Clean the area using water and mild soap twice a day or as told by your health care provider. Gently pat the area dry. ? If you were prescribed an antibiotic ointment, apply it as told by your health care provider. Do not stop using the antibiotic even if your condition improves. ? Take a warm water bath (sitz bath) as needed to help with pain and discomfort. A sitz bath is taken while you are sitting down. The water should only come up to your hips and should cover your buttocks. ? Leave stitches (sutures), skin glue, or adhesive strips in place. These skin closures may need to stay in place for 2 weeks or longer. If adhesive strip edges start to loosen and curl up, you may trim the loose edges. Do not remove adhesive strips completely unless your health care provider tells you to do that.  Check your biopsy site every day for signs of infection. Check for: ? More redness, swelling, or pain. ? More fluid or blood. ? Warmth. ? Pus or a bad smell.  Do not rub the biopsy area after urinating. Gently pat the area dry or use a bottle filled with warm water (peri-bottle) to clean the area. Gently wipe from front to back. Lifestyle  Wear loose, cotton underwear. Do not wear tight pants.  Do not use a tampon, douche, or put anything inside your vagina for at least 1 week or until your health care provider approves.  Do not have sex for at least 1 week or until your health care provider approves.  Do not exercise, such as running or biking, until your health care provider approves.  Do not swim or use a hot tub until your health care provider approves. You may shower or take a sitz bath. General instructions  Take over-the-counter and prescription medicines only as told by your health care provider.  Use a sanitary napkin until the bleeding stops.  Keep all follow-up visits as told by your health care provider. This is  important. Contact a health care provider if:  You have more redness, swelling, or pain around your biopsy site.  You have more fluid or blood coming from your biopsy site.  Your biopsy site feels warm to the touch.  Your pain is not controlled with medicine. Get help right away if you have:  Heavy bleeding from the vulva.  Pus or a bad smell coming from your biopsy site.  A fever.  Lower abdominal pain. Summary  After the procedure, it is common to have slight bleeding and discomfort at the biopsy site.  Follow instructions from your health care provider after your biopsy. Make sure you clean the area with water and mild soap. Pat the area dry.  Take sitz baths as needed to help with pain and discomfort. Leave any sutures in place.  Check your biopsy site for signs of infection, which may include more redness, swelling, pain, fluid, or blood, or feeling warm to the touch.  Get help right away if you have heavy bleeding, a fever, pus or a bad smell, or  pain in the lower abdomen. This information is not intended to replace advice given to you by your health care provider. Make sure you discuss any questions you have with your health care provider. Document Released: 09/08/2012 Document Revised: 03/25/2018 Document Reviewed: 03/25/2018 Elsevier Patient Education  2020 [employer].

## 2019-04-14 NOTE — Progress Notes (Signed)
  Subjective:     Patient ID: Christy Rojas, female   DOB: October 28, 1950, 68 y.o.   MRN: [id number]    HPI  Pap History: 1/13/ Negative  States she uses clobetasol a couple of times a year for lichen sclerosus.  No prior biopsy.  She has splitting in the episiolotomy site.   Review of Systems  Constitutional: Negative.   HENT: Negative.   Eyes: Negative.   Respiratory: Negative.   Cardiovascular: Negative.   Gastrointestinal: Negative.   Endocrine: Negative.   Genitourinary: Negative.   Musculoskeletal: Negative.   Skin: Negative.   Allergic/Immunologic: Negative.   Neurological: Negative.   Hematological: Negative.   Psychiatric/Behavioral: Negative.    Contraception: Hysterectomy    Objective:   Physical Exam Supraclitoral region, perineum, and left labia majora - pale coloration of skin.    Left labia majora biopsy Consent for procedure.  Sterile prep with betadine.  Local 1% lidocaine - lot [id number], exp 04/05/20. 3 mm punch biopsy performed.  Tissue to pathology.  Silver nitrate applied.  Minimal EBL.  No complications.   Assessment:     Vulvar irritation.  Skin hypopigmentation.  Suspect lichen sclerosus.     Plan:     FU biopsy result.  Biopsy precautions given.  We discussed lichen sclerosus at length - life history, signs/symptoms, treatment options, association with vulvar dysplasia.  Written information also given.  Anticipate continuation of clobetasol treatment.  FU prn.     After visit summary to patient.  ___15____ minutes face to face time of which over 50% was spent in counseling.

## 2019-04-15 ENCOUNTER — Encounter: Payer: Self-pay | Admitting: Obstetrics and Gynecology

## 2019-04-22 ENCOUNTER — Encounter: Payer: Self-pay | Admitting: Obstetrics and Gynecology

## 2019-04-22 DIAGNOSIS — Z1231 Encounter for screening mammogram for malignant neoplasm of breast: Secondary | ICD-10-CM | POA: Diagnosis not present

## 2019-04-22 DIAGNOSIS — Z803 Family history of malignant neoplasm of breast: Secondary | ICD-10-CM | POA: Diagnosis not present

## 2019-05-24 DIAGNOSIS — E559 Vitamin D deficiency, unspecified: Secondary | ICD-10-CM | POA: Diagnosis not present

## 2019-05-24 DIAGNOSIS — E785 Hyperlipidemia, unspecified: Secondary | ICD-10-CM | POA: Diagnosis not present

## 2019-05-24 DIAGNOSIS — R7303 Prediabetes: Secondary | ICD-10-CM | POA: Diagnosis not present

## 2019-05-24 DIAGNOSIS — Z0000 Encounter for general adult medical examination without abnormal findings: Secondary | ICD-10-CM | POA: Diagnosis not present

## 2019-05-25 DIAGNOSIS — Z0000 Encounter for general adult medical examination without abnormal findings: Secondary | ICD-10-CM | POA: Diagnosis not present

## 2019-05-25 DIAGNOSIS — Z23 Encounter for immunization: Secondary | ICD-10-CM | POA: Diagnosis not present

## 2019-05-25 DIAGNOSIS — E559 Vitamin D deficiency, unspecified: Secondary | ICD-10-CM | POA: Diagnosis not present

## 2019-05-25 DIAGNOSIS — E785 Hyperlipidemia, unspecified: Secondary | ICD-10-CM | POA: Diagnosis not present

## 2019-05-25 DIAGNOSIS — R7303 Prediabetes: Secondary | ICD-10-CM | POA: Diagnosis not present

## 2019-05-25 DIAGNOSIS — J4520 Mild intermittent asthma, uncomplicated: Secondary | ICD-10-CM | POA: Diagnosis not present

## 2019-06-27 DIAGNOSIS — Z23 Encounter for immunization: Secondary | ICD-10-CM | POA: Diagnosis not present

## 2019-08-18 DIAGNOSIS — Z961 Presence of intraocular lens: Secondary | ICD-10-CM | POA: Diagnosis not present

## 2019-08-25 DIAGNOSIS — E785 Hyperlipidemia, unspecified: Secondary | ICD-10-CM | POA: Diagnosis not present

## 2019-10-24 ENCOUNTER — Other Ambulatory Visit: Payer: PRIVATE HEALTH INSURANCE

## 2019-10-27 ENCOUNTER — Other Ambulatory Visit: Payer: Self-pay

## 2019-10-27 ENCOUNTER — Ambulatory Visit
Admission: RE | Admit: 2019-10-27 | Discharge: 2019-10-27 | Disposition: A | Discharge status: Home / Self Care | Payer: Medicare Other | Source: Ambulatory Visit | Attending: Family Medicine | Admitting: Family Medicine

## 2019-10-27 ENCOUNTER — Other Ambulatory Visit: Payer: Self-pay | Admitting: Family Medicine

## 2019-10-27 DIAGNOSIS — R0602 Shortness of breath: Secondary | ICD-10-CM | POA: Diagnosis not present

## 2019-11-01 DIAGNOSIS — L820 Inflamed seborrheic keratosis: Secondary | ICD-10-CM | POA: Diagnosis not present

## 2019-11-01 DIAGNOSIS — L821 Other seborrheic keratosis: Secondary | ICD-10-CM | POA: Diagnosis not present

## 2019-11-01 DIAGNOSIS — L308 Other specified dermatitis: Secondary | ICD-10-CM | POA: Diagnosis not present

## 2019-11-01 DIAGNOSIS — D225 Melanocytic nevi of trunk: Secondary | ICD-10-CM | POA: Diagnosis not present

## 2019-11-01 DIAGNOSIS — L814 Other melanin hyperpigmentation: Secondary | ICD-10-CM | POA: Diagnosis not present

## 2019-11-01 DIAGNOSIS — D1801 Hemangioma of skin and subcutaneous tissue: Secondary | ICD-10-CM | POA: Diagnosis not present

## 2019-11-06 ENCOUNTER — Ambulatory Visit: Payer: PRIVATE HEALTH INSURANCE

## 2019-11-27 ENCOUNTER — Ambulatory Visit: Payer: PRIVATE HEALTH INSURANCE

## 2019-12-05 DIAGNOSIS — L710 Perioral dermatitis: Secondary | ICD-10-CM | POA: Diagnosis not present

## 2019-12-05 DIAGNOSIS — R208 Other disturbances of skin sensation: Secondary | ICD-10-CM | POA: Diagnosis not present

## 2019-12-05 DIAGNOSIS — L820 Inflamed seborrheic keratosis: Secondary | ICD-10-CM | POA: Diagnosis not present

## 2019-12-13 NOTE — Progress Notes (Signed)
Cardiology Office Note:    Date:  12/14/2019   ID:  Christy Rojas, Christy Rojas 13-May-1951, MRN [id number]  PCP:  London Pepper, MD  Cardiologist:  No primary care provider on file.   Referring MD: London Pepper, MD   Chief Complaint  Patient presents with  . Hyperlipidemia  . Follow-up    Difficulty sleeping    History of Present Illness:    Christy Rojas is a 69 y.o. female with a hx of asthma, hyperlipidemia, prior stress test for chest tightness (2014 - M. Skains, negative for ischemia). Now being evaluated for DOE at request of Dr. London Pepper.  She has no particular cardiac complaints.  She has had dyspnea on exertion in the past.  Had a negative nuclear work-up by Dr. Marlou Porch in 2014.  Recent dyspnea was associated with some wheezing and improved after course of steroids.  She now denies shortness of breath although at other times she says that if she walks too far she feels short of breath.  The walking is more of a chronic complaint.  It is similar to what she had in 2014 when the nuclear stress test was performed.  She has diabetes mellitus.  She has severe hyperlipidemia with most recent LDL 173 with a total of 249.  These are slightly decreased compared to 202 and 269 in August 2020.  She has tried low-dose pravastatin and rosuvastatin in the past but had side effects.  Negative family history of CAD.  Physically active and plays tennis without significant limitations.  Past Medical History:  Diagnosis Date  . Asthma   . Endometriosis    resolved by TAH  . Hyperlipidemia   . Lichen sclerosus of female genitalia 2020  . Rectal fissure 12/97    Past Surgical History:  Procedure Laterality Date  . ABDOMINAL HYSTERECTOMY    . TONSILLECTOMY AND ADENOIDECTOMY  1961    Current Medications: Current Meds  Medication Sig  . ADVAIR DISKUS 100-50 MCG/DOSE AEPB   . Cetirizine HCl (ZYRTEC PO) Take by mouth as needed.  . clobetasol cream (TEMOVATE) 0.05 % Apply thinly to  affected area twice daily x 1 week and then once daily x 1 week  . Coenzyme Q10 (COQ10 PO) Take 1 capsule by mouth daily.  . Cyanocobalamin (VITAMIN B-12 PO) Take 1 tablet by mouth as needed.   . diclofenac sodium (VOLTAREN) 1 % GEL Apply 2 g topically as directed.  Marland Kitchen esomeprazole (NEXIUM) 40 MG capsule Take 20 mg by mouth daily before breakfast. Reported on 12/04/2015  . fexofenadine (ALLEGRA) 180 MG tablet Take 180 mg by mouth daily.  . mometasone (NASONEX) 50 MCG/ACT nasal spray Place 2 sprays into the nose daily.  . montelukast (SINGULAIR) 10 MG tablet Take 10 mg by mouth at bedtime.    . Multiple Vitamins-Minerals (MULTIVITAMINS THER. W/MINERALS) TABS Take 1 tablet by mouth daily.    . Omega-3 Fatty Acids (FISH OIL PO) Take 1 capsule by mouth daily.    . predniSONE (DELTASONE) 20 MG tablet Take 20 mg by mouth as directed.  . triamcinolone cream (KENALOG) 0.1 %   . UNABLE TO FIND flovent inhaler     Allergies:   Other, Latex, and Nimbex [atracurium besylate]   Social History   Socioeconomic History  . Marital status: Married    Spouse name: Not on file  . Number of children: Not on file  . Years of education: Not on file  . Highest education level: Not on file  Occupational History  . Not on file  Tobacco Use  . Smoking status: Never Smoker  . Smokeless tobacco: Never Used  Substance and Sexual Activity  . Alcohol use: Yes    Alcohol/week: 1.0 standard drinks    Types: 1 Standard drinks or equivalent per week  . Drug use: Never  . Sexual activity: Not Currently    Partners: Male    Birth control/protection: Surgical    Comment: hysterectomy  Other Topics Concern  . Not on file  Social History Narrative  . Not on file   Social Determinants of Health   Financial Resource Strain:   . Difficulty of Paying Living Expenses: Not on file  Food Insecurity:   . Worried About Charity fundraiser in the Last Year: Not on file  . Ran Out of Food in the Last Year: Not on file    Transportation Needs:   . Lack of Transportation (Medical): Not on file  . Lack of Transportation (Non-Medical): Not on file  Physical Activity:   . Days of Exercise per Week: Not on file  . Minutes of Exercise per Session: Not on file  Stress:   . Feeling of Stress : Not on file  Social Connections:   . Frequency of Communication with Friends and Family: Not on file  . Frequency of Social Gatherings with Friends and Family: Not on file  . Attends Religious Services: Not on file  . Active Member of Clubs or Organizations: Not on file  . Attends Archivist Meetings: Not on file  . Marital Status: Not on file     Family History: The patient's family history includes Breast cancer (age of onset: 66) in her mother.  ROS:   Please see the history of present illness.    She has not been sleeping well.  She has occasional cough.  She has not been able to tolerate statins.  All other systems reviewed and are negative.  EKGs/Labs/Other Studies Reviewed:    The following studies were reviewed today: No recent cardiology imaging or testing.  EKG:  EKG normal sinus rhythm with normal appearance  Recent Labs: No results found for requested labs within last 8760 hours.  Recent Lipid Panel No results found for: CHOL, TRIG, HDL, CHOLHDL, VLDL, LDLCALC, LDLDIRECT  Physical Exam:    VS:  BP 114/66   Pulse 79   Ht 5\' 1"  (1.549 m)   Wt 166 lb 6.4 oz (75.5 kg)   LMP  (LMP Unknown)   SpO2 98%   BMI 31.44 kg/m     Wt Readings from Last 3 Encounters:  12/14/19 166 lb 6.4 oz (75.5 kg)  04/14/19 155 lb (70.3 kg)  03/04/19 161 lb (73 kg)     GEN: Moderate obesity. No acute distress HEENT: Normal NECK: No JVD. LYMPHATICS: No lymphadenopathy CARDIAC:  RRR without murmur, gallop, or edema. VASCULAR:  Normal Pulses. No bruits. RESPIRATORY:  Clear to auscultation without rales, wheezing or rhonchi  ABDOMEN: Soft, non-tender, non-distended, No pulsatile mass, MUSCULOSKELETAL:  No deformity  SKIN: Warm and dry NEUROLOGIC:  Alert and oriented x 3 PSYCHIATRIC:  Normal affect   ASSESSMENT:    1. DOE (dyspnea on exertion)   2. Hyperlipidemia LDL goal <70   3. Prediabetes   4. Morbid obesity ([place])   5. Educated about COVID-19 virus infection    PLAN:    In order of problems listed above:  1. No evidence of volume overload.  Breathing has significantly improved after  steroid Dosepak.  May need to consider diastolic dysfunction if coronary calcium score is significantly abnormal.  If that were the case perhaps coronary CT angiography would need to be performed.  The other option would be to perform a 2D Doppler echocardiogram to assess diastolic dysfunction. 2. In the presence of diabetes/prediabetes, LDL should be less than 70.  She has tried statins in the past and was not able to tolerate low doses.  I have recommended a coronary calcium score.  If she has minimal or no calcium, we should not worry about the cholesterol for at least another 5 years.  If there is significant coronary calcification, we will need to graduate to PCSK9 therapy, Nexletol, or some other combination.  She needs about 60% reduction to be a target for diabetes. 3. Target hemoglobin A1c less than 7.  Consider SGLT2 therapy to decrease the risk of cardiac events 4. Weight loss and moderate activity are discussed. 5. COVID-19 vaccine is been received.  Social distancing and mask wearing is being practiced. 6. Sleep apnea would be another risk factor for future CV disease.  May need to consider sleep study if elevated calcium score.  Overall education and awareness concerning primary risk prevention was discussed in detail: LDL less than 70, hemoglobin A1c less than 7, blood pressure target less than 130/80 mmHg, >150 minutes of moderate aerobic activity per week, avoidance of smoking, weight control (via diet and exercise), and continued surveillance/management of/for obstructive sleep  apnea.    Medication Adjustments/Labs and Tests Ordered: Current medicines are reviewed at length with the patient today.  Concerns regarding medicines are outlined above.  Orders Placed This Encounter  Procedures  . CT CARDIAC SCORING  . EKG 12-Lead   No orders of the defined types were placed in this encounter.   Patient Instructions  Medication Instructions:  Your physician recommends that you continue on your current medications as directed. Please refer to the Current Medication list given to you today.  *If you need a refill on your cardiac medications before your next appointment, please call your pharmacy*   Lab Work: None If you have labs (blood work) drawn today and your tests are completely normal, you will receive your results only by: Marland Kitchen MyChart Message (if you have MyChart) OR . A paper copy in the mail If you have any lab test that is abnormal or we need to change your treatment, we will call you to review the results.   Testing/Procedures: Your physician recommends that you have a Calcium Score performed.    Follow-Up: At [facility], you and your health needs are our priority.  As part of our continuing mission to provide you with exceptional heart care, we have created designated Provider Care Teams.  These Care Teams include your primary Cardiologist (physician) and Advanced Practice Providers (APPs -  Physician Assistants and Nurse Practitioners) who all work together to provide you with the care you need, when you need it.  We recommend signing up for the patient portal called "MyChart".  Sign up information is provided on this After Visit Summary.  MyChart is used to connect with patients for Virtual Visits (Telemedicine).  Patients are able to view lab/test results, encounter notes, upcoming appointments, etc.  Non-urgent messages can be sent to your provider as well.   To learn more about what you can do with MyChart, go to [email or web address].     Your next appointment:   As needed  The format for your next appointment:  In Person  Provider:   You may see Dr. Daneen Schick or one of the following Advanced Practice Providers on your designated Care Team:    Truitt Merle, NP  Cecilie Kicks, NP  Kathyrn Drown, NP    Other Instructions          Signed, Sinclair Grooms, MD  12/14/2019 3:19 PM    [place]

## 2019-12-14 ENCOUNTER — Ambulatory Visit (INDEPENDENT_AMBULATORY_CARE_PROVIDER_SITE_OTHER): Payer: Medicare Other | Admitting: Interventional Cardiology

## 2019-12-14 ENCOUNTER — Other Ambulatory Visit: Payer: Self-pay

## 2019-12-14 ENCOUNTER — Encounter: Payer: Self-pay | Admitting: Interventional Cardiology

## 2019-12-14 VITALS — BP 114/66 | HR 79 | Ht 61.0 in | Wt 166.4 lb

## 2019-12-14 DIAGNOSIS — Z7189 Other specified counseling: Secondary | ICD-10-CM

## 2019-12-14 DIAGNOSIS — G4700 Insomnia, unspecified: Secondary | ICD-10-CM

## 2019-12-14 DIAGNOSIS — R7303 Prediabetes: Secondary | ICD-10-CM

## 2019-12-14 DIAGNOSIS — E785 Hyperlipidemia, unspecified: Secondary | ICD-10-CM

## 2019-12-14 DIAGNOSIS — R0609 Other forms of dyspnea: Secondary | ICD-10-CM

## 2019-12-14 DIAGNOSIS — R0600 Dyspnea, unspecified: Secondary | ICD-10-CM

## 2019-12-14 NOTE — Patient Instructions (Signed)
Medication Instructions:  Your physician recommends that you continue on your current medications as directed. Please refer to the Current Medication list given to you today.  *If you need a refill on your cardiac medications before your next appointment, please call your pharmacy*   Lab Work: None If you have labs (blood work) drawn today and your tests are completely normal, you will receive your results only by: . MyChart Message (if you have MyChart) OR . A paper copy in the mail If you have any lab test that is abnormal or we need to change your treatment, we will call you to review the results.   Testing/Procedures: Your physician recommends that you have a Calcium Score performed.   Follow-Up: At [facility], you and your health needs are our priority.  As part of our continuing mission to provide you with exceptional heart care, we have created designated Provider Care Teams.  These Care Teams include your primary Cardiologist (physician) and Advanced Practice Providers (APPs -  Physician Assistants and Nurse Practitioners) who all work together to provide you with the care you need, when you need it.  We recommend signing up for the patient portal called "MyChart".  Sign up information is provided on this After Visit Summary.  MyChart is used to connect with patients for Virtual Visits (Telemedicine).  Patients are able to view lab/test results, encounter notes, upcoming appointments, etc.  Non-urgent messages can be sent to your provider as well.   To learn more about what you can do with MyChart, go to [email or web address].    Your next appointment:   As needed  The format for your next appointment:   In Person  Provider:   You may see Dr. Henry Smith or one of the following Advanced Practice Providers on your designated Care Team:    Lori Gerhardt, NP  Laura Ingold, NP  Jill McDaniel, NP    Other Instructions   

## 2019-12-27 ENCOUNTER — Encounter: Payer: Self-pay | Admitting: Certified Nurse Midwife

## 2019-12-29 ENCOUNTER — Other Ambulatory Visit: Payer: Self-pay

## 2019-12-29 ENCOUNTER — Ambulatory Visit (INDEPENDENT_AMBULATORY_CARE_PROVIDER_SITE_OTHER)
Admission: RE | Admit: 2019-12-29 | Discharge: 2019-12-29 | Disposition: A | Discharge status: Home / Self Care | Payer: Self-pay | Source: Ambulatory Visit | Attending: Interventional Cardiology | Admitting: Interventional Cardiology

## 2019-12-29 DIAGNOSIS — R0609 Other forms of dyspnea: Secondary | ICD-10-CM

## 2019-12-29 DIAGNOSIS — R0600 Dyspnea, unspecified: Secondary | ICD-10-CM

## 2020-01-02 ENCOUNTER — Telehealth: Payer: Self-pay | Admitting: Interventional Cardiology

## 2020-01-02 NOTE — Telephone Encounter (Signed)
Spoke with pt and reviewed results.  Pt appreciative for call. 

## 2020-01-02 NOTE — Telephone Encounter (Signed)
Patient returning call for CT results. 

## 2020-01-05 ENCOUNTER — Ambulatory Visit (INDEPENDENT_AMBULATORY_CARE_PROVIDER_SITE_OTHER): Payer: Medicare Other | Admitting: Pharmacist

## 2020-01-05 ENCOUNTER — Other Ambulatory Visit: Payer: Self-pay

## 2020-01-05 DIAGNOSIS — E782 Mixed hyperlipidemia: Secondary | ICD-10-CM | POA: Diagnosis not present

## 2020-01-05 DIAGNOSIS — E785 Hyperlipidemia, unspecified: Secondary | ICD-10-CM | POA: Diagnosis not present

## 2020-01-05 DIAGNOSIS — G720 Drug-induced myopathy: Secondary | ICD-10-CM

## 2020-01-05 DIAGNOSIS — T466X5A Adverse effect of antihyperlipidemic and antiarteriosclerotic drugs, initial encounter: Secondary | ICD-10-CM

## 2020-01-05 MED ORDER — PRALUENT 150 MG/ML ~~LOC~~ SOAJ: 1.0000 "pen " | SUBCUTANEOUS | 11 refills | Status: DC

## 2020-01-05 NOTE — Progress Notes (Signed)
Patient ID: Christy Rojas                 DOB: Feb 11, 1951                    MRN: [id number]     HPI: Christy Rojas is a 69 y.o. female patient referred to lipid clinic by Dr Tamala Julian. PMH is significant for pre-DM, HTN, HLD, obesity, asthma, and DOE. Prior stress test in 2014 was negative for ischemia. She underwent coronary calcium scoring on 12/29/19 which revealed calcium score of 7.4 which was 56th percentile when matched for age and sex with intermediate risk for future cardiac events.  Patient presents today in good spirits. She has researched other cholesterol medications prior to her visit today. Reports first trying pravastatin 10mg  daily but experienced muscle cramps and headaches. She then tried rosuvastatin 5mg  every other day but experienced the same issues. She is very active as a Firefighter, dog walker, goes to the gym and rides her bike. Reports strong family history of high cholesterol in her parents as well as her daughter.  Current Medications: OTC fish oil Intolerances: pravastatin 10mg  daily, rosuvastatin 5mg  every other day - muscle pain, headaches Risk Factors: pre-DM, obesity, HTN, elevated calcium score LDL goal: 70mg /dL  Diet: [place] - loves to cook. Has tried to cut back on cheese. Eating more oatmeal with fruit. Metamucil gives her reflux and bothers her stomach  But does eat a Metamucil cookie each day. Drinks water and pomegranate juice.  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  Social History: No tobacco use, occasional alcohol use, no illicit drug use.  Labs: 08/25/19: TC 249, TG 79, HDL 60, LDL 173, nonHDL 189 (no lipid lowering therapy)  Past Medical History:  Diagnosis Date  . Asthma   . Endometriosis    resolved by TAH  . Hyperlipidemia   . Lichen sclerosus  of female genitalia 2020  . Rectal fissure 12/97    Current Outpatient Medications on File Prior to Visit  Medication Sig Dispense Refill  . ADVAIR DISKUS 100-50 MCG/DOSE AEPB     . Cetirizine HCl (ZYRTEC PO) Take by mouth as needed.    . clobetasol cream (TEMOVATE) 0.05 % Apply thinly to affected area twice daily x 1 week and then once daily x 1 week 60 g 1  . Coenzyme Q10 (COQ10 PO) Take 1 capsule by mouth daily.    . Cyanocobalamin (VITAMIN B-12 PO) Take 1 tablet by mouth as needed.     . diclofenac sodium (VOLTAREN) 1 % GEL Apply 2 g topically as directed. 300 Tube 5  . esomeprazole (NEXIUM) 40 MG capsule Take 20 mg by mouth daily before breakfast. Reported on 12/04/2015    . fexofenadine (ALLEGRA) 180 MG tablet Take 180 mg by mouth daily.    . mometasone (NASONEX) 50 MCG/ACT nasal spray Place 2 sprays into the nose daily.    . montelukast (SINGULAIR) 10 MG tablet Take 10 mg by mouth at bedtime.      . Multiple Vitamins-Minerals (MULTIVITAMINS THER. W/MINERALS) TABS Take 1 tablet by mouth daily.      . Omega-3 Fatty Acids (FISH OIL PO) Take 1 capsule by mouth daily.      . predniSONE (DELTASONE) 20 MG tablet Take 20 mg by  mouth as directed.    . triamcinolone cream (KENALOG) 0.1 %     . UNABLE TO FIND flovent inhaler     No current facility-administered medications on file prior to visit.    Allergies  Allergen Reactions  . Other Other (See Comments) and Anaphylaxis    Patient states that there is an allergy to something she received in anesthesia that begins with  (mes). They are pretty sure it was the latex but considered the other one to be an allergy as well since it was a high severity.  . Latex Other (See Comments)    Patient almost died.  . Nimbex [Atracurium Besylate]     Assessment/Plan:  1. Hyperlipidemia - Baseline LDL 173 above aggressive goal < 70 due to pre-DM and elevated calcium score. She is intolerant to very low doses of 2 hydrophilic statins. Discussed  Nexlizet and PCSK9i therapies as best options to lower her cholesterol. PCSK9i monotherapy would be the most effective at bringing her LDL to goal given her high baseline. Discussed expected benefits, side effects, and injection technique of PCSK9i. Will start Praluent 150mg  Q2W (preferred PCSK9i on formulary). Also applied for [employer] which was approved for $2500 in copay assistance. Will recheck fasting lipid panel and LFTs in 8 weeks. Pt aware to call with any issues before then.  Kandi Brusseau E. Kye Hedden, PharmD, BCACP, [place] [id number] [address] [place], [place] [address] Phone: [phone]; Fax: [phone] 01/05/2020 10:59 AM

## 2020-01-05 NOTE — Patient Instructions (Addendum)
It was nice to meet you today!  Your LDL cholesterol is 173 and your goal is < 70  I will submit information to your insurance for Praluent injections that you will take every 2 weeks. Keep the medication stored in the fridge, you can let it sit for 30-60 minutes to let it warm to room temperature before each injection. Inject into the fatty tissue in your abdomen, upper outer thigh, or back of your arm  I will call you when your insurance approves this  I will also apply for a grant through the [facility] to cover your copay   Recheck fasting cholesterol on Tuesday, June 15th any time after 7:30am  Call Jinny Blossom, Pharmacist with any questions [phone]

## 2020-01-10 ENCOUNTER — Telehealth: Payer: Self-pay | Admitting: Pharmacist

## 2020-01-10 NOTE — Telephone Encounter (Signed)
Pt returned call to clinic, states she plans to pick up her injections today.

## 2020-01-10 NOTE — Telephone Encounter (Signed)
Left 2nd message for pt to advise her that her Praluent was approved. Called pharmacy who states rx has been in their waiting bin for 5 days but pt has not picked it up yet. Also tried calling pt's husband's # but he did not answer either.

## 2020-01-19 ENCOUNTER — Telehealth: Payer: Self-pay | Admitting: Pharmacist

## 2020-01-19 NOTE — Telephone Encounter (Signed)
Received voicemail from patient that she was having a side effect from praluent she wanted to discuss. Called patient back- went right to voicemail. Left message.

## 2020-01-20 NOTE — Telephone Encounter (Signed)
Patient called and left another voicemail. States she is having "all the side effects from praluent and doesn't think she is going to take another injection" States she has a bad cramp in her calf. Stated it was ok to wait until Monday and have Megan call her back.

## 2020-01-23 MED ORDER — NEXLIZET 180-10 MG PO TABS: 1.0000 | ORAL_TABLET | Freq: Every day | ORAL | 11 refills | Status: DC

## 2020-01-23 NOTE — Addendum Note (Signed)
Addended by: Janelie Goltz E on: 01/23/2020 09:55 AM   Modules accepted: Orders

## 2020-01-23 NOTE — Telephone Encounter (Signed)
Pt returned call to clinic. She has given herself 1 injection of Praluent so far and reports multiple side effects including cramping in her legs, headache, rash, can't sleep, bruising, and lower back pain. Has tried CoQ 10 and mustard to help with the cramping, neither have worked well.   Discussed trying lower dose of Praluent, Repatha, or Nexlizet. Pt prefers to try Nexlizet. Has [employer] through [place].  ID: [id number] [id number] PCN: [id number] GRP: [id number]  Prior auth for Nexlizet has been submitted. She has an active [employer] so her copay will be $0.

## 2020-01-23 NOTE — Telephone Encounter (Signed)
Left message for pt to discuss.  She is previously intolerant to pravastatin 10mg  daily and rosuvastatin 5mg  every other day (muscle pain and headaches).  Will discuss trying Repatha or Nexlizet with pt when she returns call.

## 2020-01-23 NOTE — Addendum Note (Signed)
Addended by: Zyion Doxtater E on: 01/23/2020 10:23 AM   Modules accepted: Orders

## 2020-01-23 NOTE — Telephone Encounter (Addendum)
Prior auth for Nexlizet has been approved through 01/22/21. Rx sent to pharmacy, confirmed $0 copay. Pt is aware and will call with any tolerability issues.

## 2020-01-26 NOTE — Telephone Encounter (Signed)
Spoke with patient about pain/sore calf. She is stretching/using foam roller and applying heat. She got a massage. Started as a cramp during Environmental manager. Was not too bothersome. But then progressively got worse. Almost had to forfit match. Is very sore now. I recommended magnesium spray (although not great evidence) or scraping with lotion and back of a spoon. I also suggested that she might just want to leave it alone so it can heal. Her last injection was about 2 weeks ago. If it is from the Praluent then it should improve soon.  She is scared to take the Nexlizet Megan prescribed for her. Saying she wants to take 1/2 tablet. Will not start until after her vacation. I advised it was ok to delay until after vacation. Also advised it was ok to take 1/2 tab for a max of 2 weeks, but then if she was feeling ok she needed to increase to a full tablet. I have pushed her labs out to 7/19.   We also discussed her coronary calcium score and what it means.

## 2020-01-26 NOTE — Telephone Encounter (Signed)
Pt left message stating she is still having awful cramps in her leg from Praluent and is looking for advice. Also states she does not plan to start it until May 10 when she returns from vacation.  Returned call to pt and left message. Can recommend stretching/massaging her calf, applying heat compresses, and staying hydrated with water. Will also need to move out follow up labs if pt plans to wait another few weeks before starting Nexletol.

## 2020-02-29 NOTE — Progress Notes (Signed)
69 y.o. G1P1 Married Caucasian female here for annual exam.    Patient complaining of rash under breast.  Patient would like refill on proctosol.  She has lichen sclerosus.  No refill needed for clobetasol.   She did an injection for treatment of her cholesterol. She had muscle cramping and muscle tone decreased.  She will see her cardiologist in July and have her cholesterol rechecked.   She takes prednisone for her asthma as needed.  She had her Covid vaccine.   PCP: London Pepper, MD  No LMP recorded (lmp unknown). Patient has had a hysterectomy.           Sexually active: No.  The current method of family planning is post menopausal status/Hysterectomy.    Exercising: Yes.    tennis 3-5x/week and gym Smoker:  no  Health Maintenance: Pap: 1/13 Negative History of abnormal Pap yes, years ago--unsure if had any treatment MMG: 04-22-19 3D/Neg/density B/BiRads1 Colonoscopy: 2017 normal;next 10 years BMD: 04-20-18  Result :Osteopenia Bil.hips? TDaP: 2015 Gardasil:   no HIV: Neg with PCP Hep C:Neg with PCP Screening Labs:  PCP.    reports that she has never smoked. She has never used smokeless tobacco. She reports current alcohol use of about 1.0 standard drinks of alcohol per week. She reports that she does not use drugs.  Past Medical History:  Diagnosis Date  . Asthma   . Endometriosis    resolved by TAH  . Hyperlipidemia   . Lichen sclerosus of female genitalia 2020  . Rectal fissure 12/97    Past Surgical History:  Procedure Laterality Date  . ABDOMINAL HYSTERECTOMY    . TONSILLECTOMY AND ADENOIDECTOMY  1961    Current Outpatient Medications  Medication Sig Dispense Refill  . ADVAIR DISKUS 100-50 MCG/DOSE AEPB     . Bempedoic Acid-Ezetimibe (NEXLIZET) 180-10 MG TABS Take 1 tablet by mouth daily. 30 tablet 11  . Cetirizine HCl (ZYRTEC PO) Take by mouth as needed.    . clobetasol cream (TEMOVATE) 0.05 % Apply thinly to affected area twice daily x 1 week and  then once daily x 1 week 60 g 1  . Coenzyme Q10 (COQ10 PO) Take 1 capsule by mouth daily.    . Cyanocobalamin (VITAMIN B-12 PO) Take 1 tablet by mouth as needed.     . diclofenac sodium (VOLTAREN) 1 % GEL Apply 2 g topically as directed. 300 Tube 5  . esomeprazole (NEXIUM) 40 MG capsule Take 20 mg by mouth daily before breakfast. Reported on 12/04/2015    . fexofenadine (ALLEGRA) 180 MG tablet Take 180 mg by mouth daily.    . mometasone (NASONEX) 50 MCG/ACT nasal spray Place 2 sprays into the nose daily.    . montelukast (SINGULAIR) 10 MG tablet Take 10 mg by mouth at bedtime.      . Multiple Vitamins-Minerals (MULTIVITAMINS THER. W/MINERALS) TABS Take 1 tablet by mouth daily.      . Omega-3 Fatty Acids (FISH OIL PO) Take 1 capsule by mouth daily.      Marland Kitchen OVER THE COUNTER MEDICATION Nutrim, Takes 1 tablet daily for cholesterol    . predniSONE (DELTASONE) 20 MG tablet Take 20 mg by mouth as directed.     No current facility-administered medications for this visit.    Family History  Problem Relation Age of Onset  . Breast cancer Mother 20    Review of Systems  All other systems reviewed and are negative.   Exam:   BP 110/64  Pulse 64   Temp (!) 97.2 F (36.2 C) (Temporal)   Resp 14   Ht 5' 0.5" (1.537 m)   Wt 166 lb 9.6 oz (75.6 kg)   LMP  (LMP Unknown)   BMI 32.00 kg/m     General appearance: alert, cooperative and appears stated age Head: normocephalic, without obvious abnormality, atraumatic Neck: no adenopathy, supple, symmetrical, trachea midline and thyroid normal to inspection and palpation Lungs: clear to auscultation bilaterally Breasts: normal appearance, no masses or tenderness, No nipple retraction or dimpling, No nipple discharge or bleeding, No axillary adenopathy.  Red plaques under bilateral breasts.  Heart: regular rate and rhythm Abdomen: soft, non-tender; no masses, no organomegaly Extremities: extremities normal, atraumatic, no cyanosis or edema Skin:  skin color, texture, turgor normal. No rashes or lesions Lymph nodes: cervical, supraclavicular, and axillary nodes normal. Neurologic: grossly normal  Pelvic: External genitalia:  Whitish change along supraclitoral region and perineum.              No abnormal inguinal nodes palpated.              Urethra:  normal appearing urethra with no masses, tenderness or lesions              Bartholins and Skenes: normal                 Vagina: normal appearing vagina with normal color and discharge, no lesions              Cervix: absent              Pap taken: No. Bimanual Exam:  Uterus: absent              Adnexa: no mass, fullness, tenderness              Rectal exam: Yes.  .  Confirms.  Small hemorrhoid externally.               Anus:  normal sphincter tone, no lesions  Chaperone was present for exam.  Assessment:   Well woman visit with normal exam. Status post TAH/BSO  for endometriosis.  FH breast cancer in patient's mother in her 29s.  Declines genetic testing.  Hx lichen sclerosus.  Osteopenia.  Candida of flexural folds.   Plan: Mammogram screening discussed. Pap and HR HPV as above. Guidelines for Calcium, Vitamin D, regular exercise program including cardiovascular and weight bearing exercise. BMD this summer?  I am unable to pull up the report in Epic.  The patient will check with [facility] when she calls to make her mammogram appointment.  I will need to sign the order if this is needed. Nystatin powder.  [place] for use of Clobetasol twice weekly for maintenance. Follow up annually and prn.     After visit summary provided.

## 2020-03-01 ENCOUNTER — Ambulatory Visit (INDEPENDENT_AMBULATORY_CARE_PROVIDER_SITE_OTHER): Payer: Medicare Other | Admitting: Obstetrics and Gynecology

## 2020-03-01 ENCOUNTER — Encounter: Payer: Self-pay | Admitting: Obstetrics and Gynecology

## 2020-03-01 ENCOUNTER — Other Ambulatory Visit: Payer: Self-pay

## 2020-03-01 VITALS — BP 110/64 | HR 64 | Temp 97.2°F | Resp 14 | Ht 60.5 in | Wt 166.6 lb

## 2020-03-01 DIAGNOSIS — Z01419 Encounter for gynecological examination (general) (routine) without abnormal findings: Secondary | ICD-10-CM | POA: Diagnosis not present

## 2020-03-01 DIAGNOSIS — Z124 Encounter for screening for malignant neoplasm of cervix: Secondary | ICD-10-CM | POA: Diagnosis not present

## 2020-03-01 MED ORDER — NYSTATIN 100000 UNIT/GM EX POWD: 1.0000 "application " | Freq: Three times a day (TID) | CUTANEOUS | 3 refills | Status: DC

## 2020-03-01 NOTE — Patient Instructions (Signed)

## 2020-03-11 IMAGING — CR DG CHEST 2V
2 series · 2 of 2 positions shown · non-contrast
Comparison: August 20, 2010

CLINICAL DATA: Shortness of breath

EXAM:
CHEST - 2 VIEW

[w chest pa]
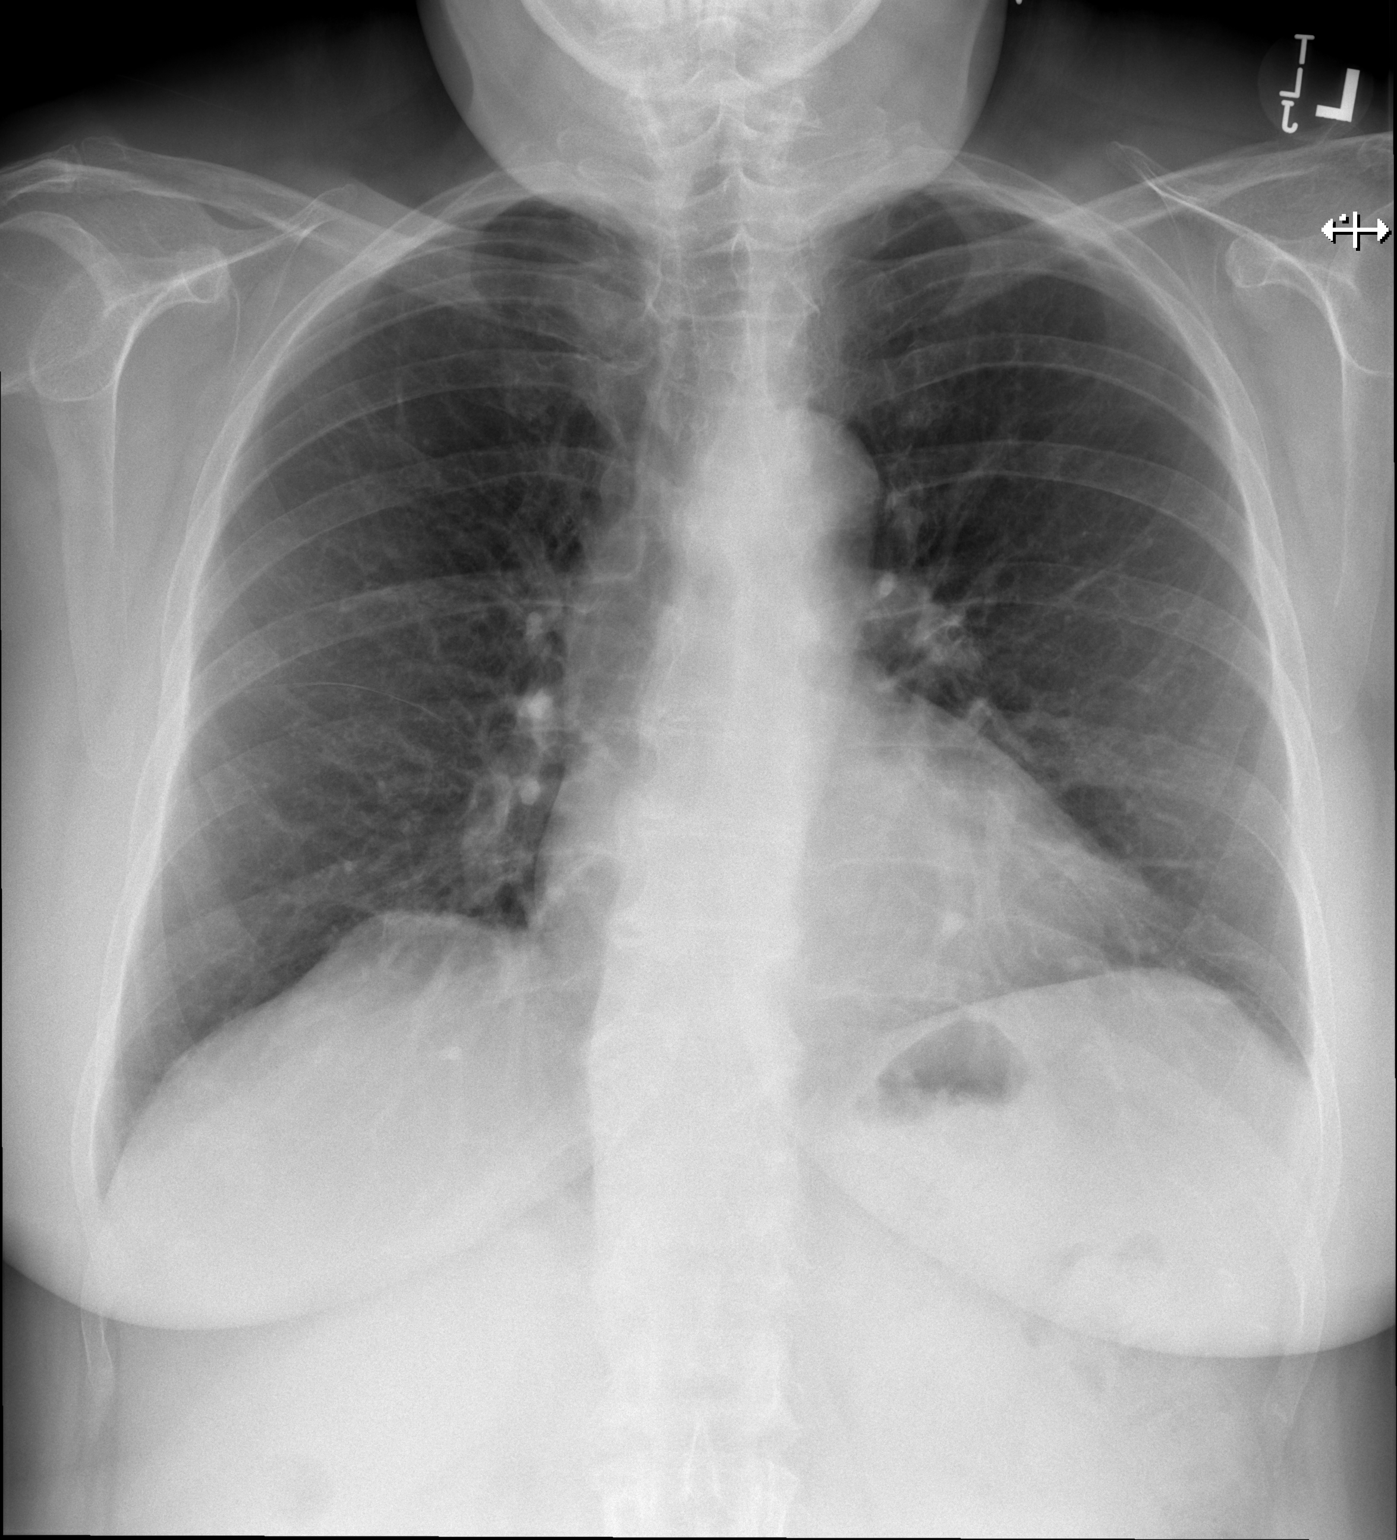

[w chest lat]
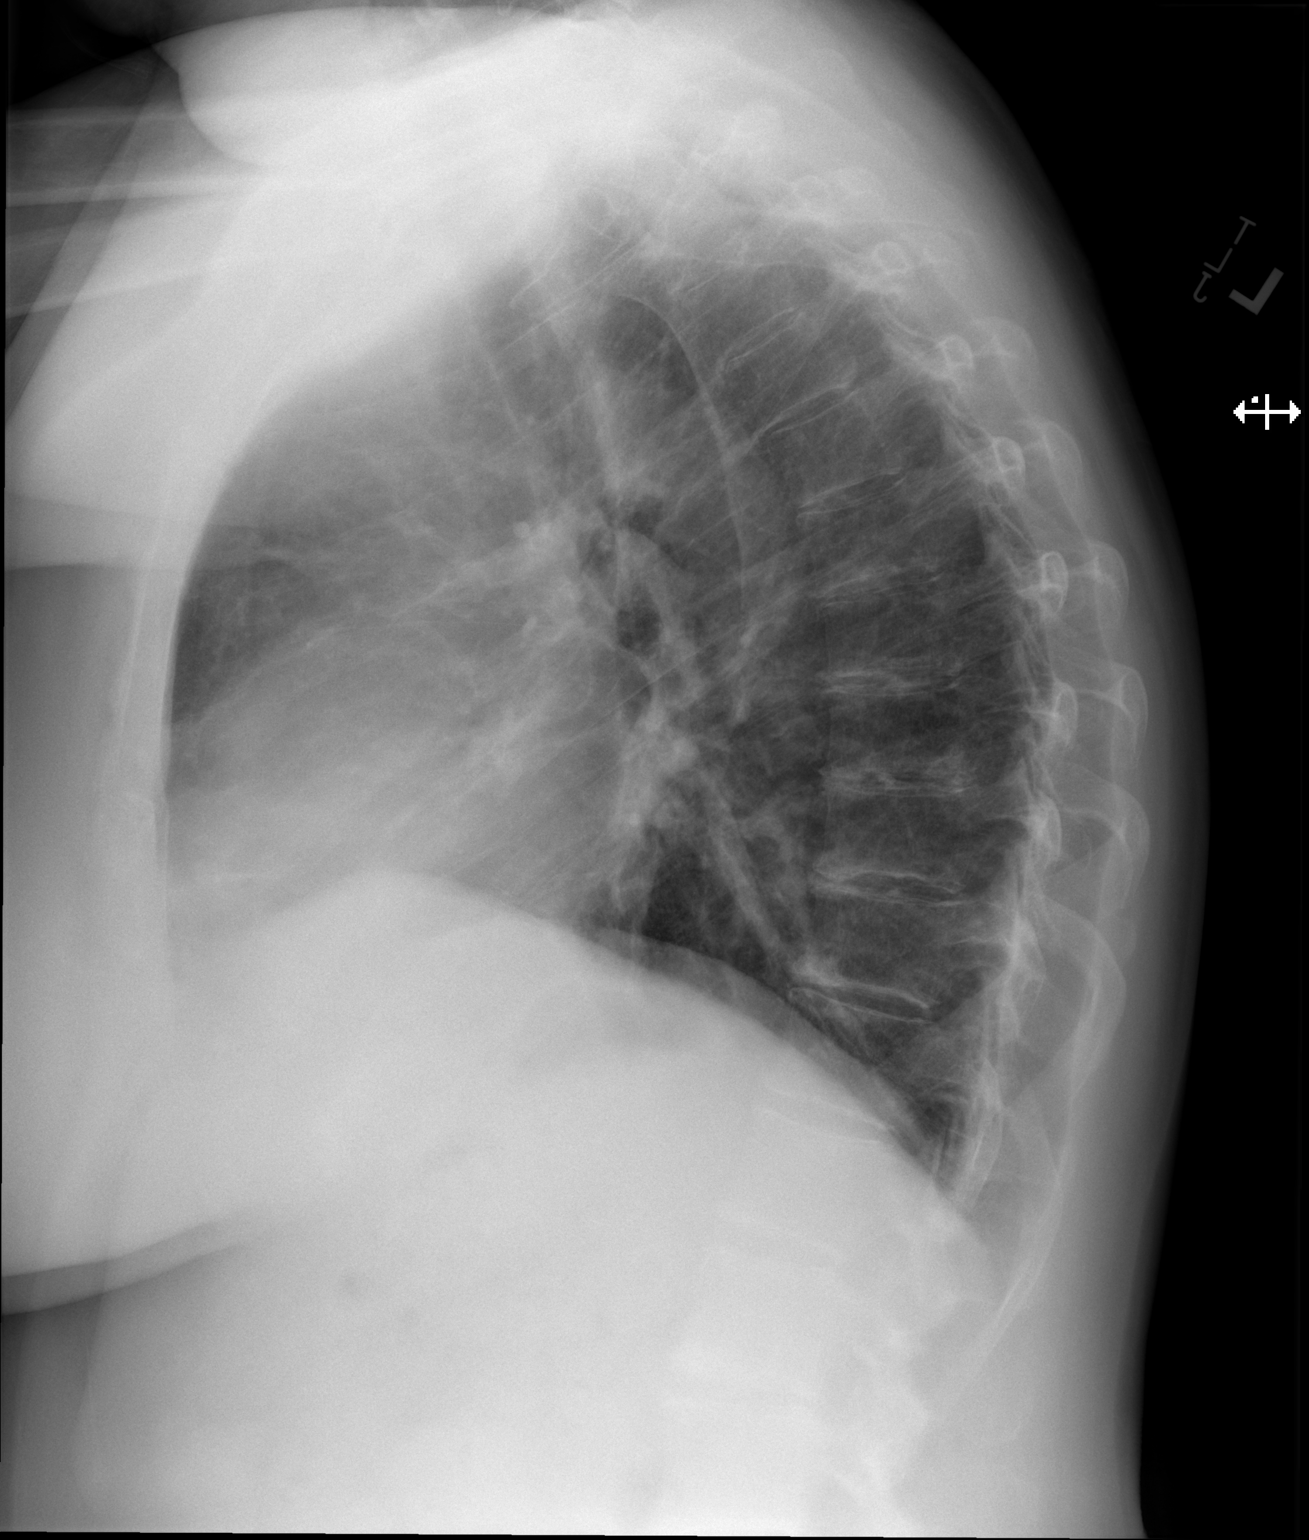

[2 of 2 positions shown; findings below may reference images not displayed]

FINDINGS: There is minimal bibasilar scarring. Lungs elsewhere are clear.
Heart size and pulmonary vascularity are normal. No adenopathy.
There is mild degenerative change in the thoracic spine.
IMPRESSION: Minimal bibasilar scarring. Lungs elsewhere clear. Cardiac
silhouette normal. No adenopathy.

## 2020-03-15 DIAGNOSIS — L820 Inflamed seborrheic keratosis: Secondary | ICD-10-CM | POA: Diagnosis not present

## 2020-03-15 DIAGNOSIS — L918 Other hypertrophic disorders of the skin: Secondary | ICD-10-CM | POA: Diagnosis not present

## 2020-03-15 DIAGNOSIS — L814 Other melanin hyperpigmentation: Secondary | ICD-10-CM | POA: Diagnosis not present

## 2020-03-20 ENCOUNTER — Other Ambulatory Visit: Payer: Medicare Other

## 2020-04-23 ENCOUNTER — Other Ambulatory Visit: Payer: Medicare Other

## 2020-04-27 DIAGNOSIS — Z1231 Encounter for screening mammogram for malignant neoplasm of breast: Secondary | ICD-10-CM | POA: Diagnosis not present

## 2020-05-10 ENCOUNTER — Encounter: Payer: Self-pay | Admitting: Obstetrics and Gynecology

## 2020-05-13 IMAGING — CT CT CARDIAC CORONARY ARTERY CALCIUM SCORE
3 series · 14 of 20 positions shown, 15 images · non-contrast
Comparison: None.
COMPARISON: None.

Addendum:
EXAM:
OVER-READ INTERPRETATION  CT CHEST

The following report is an over-read performed by radiologist Dr.
Jhan Carlo Neuque [REDACTED] on 12/29/2019. This
over-read does not include interpretation of cardiac or coronary
anatomy or pathology. The coronary calcium score/coronary CTA
interpretation by the cardiologist is attached.
CLINICAL DATA: Risk stratification
Coronary Calcium Score
TECHNIQUE: The patient was scanned on a Siemens Sensation 16 slice scanner.
Axial non-contrast 3mm slices were carried out through the heart.
The data set was analyzed on a dedicated work station and scored
using the Agatson method.

[Series 2: casc 3.0 bv41 2 bestdiast 70 % · axial · 0.36mm/px · z∈[-234,-152]mm · 4 of 45 slices shown, 5 images]
[im 9/45  vessel]
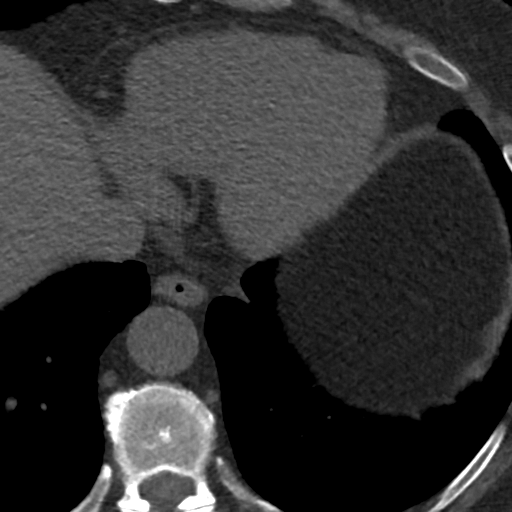
[im 9/45  lung]
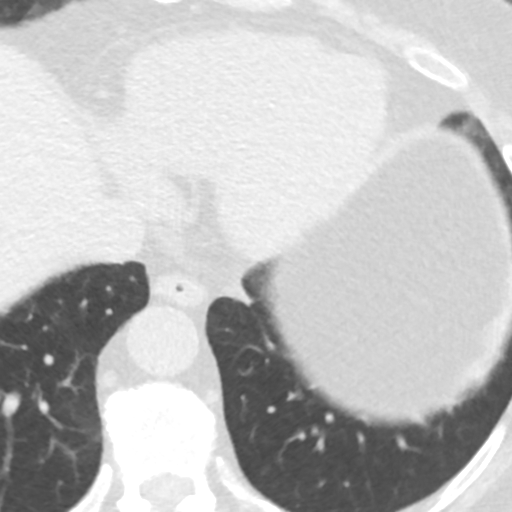
[im 18/45  vessel]
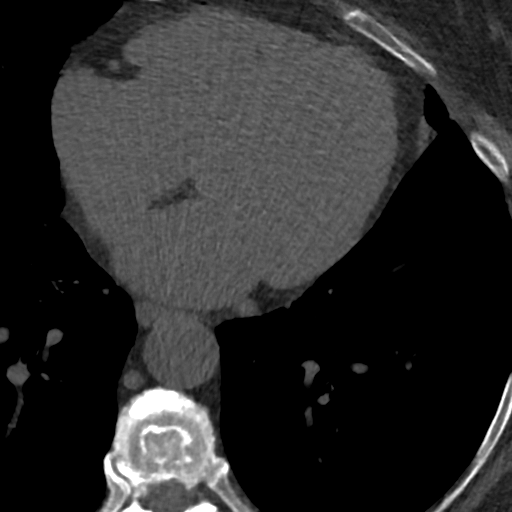
[im 27/45  vessel]
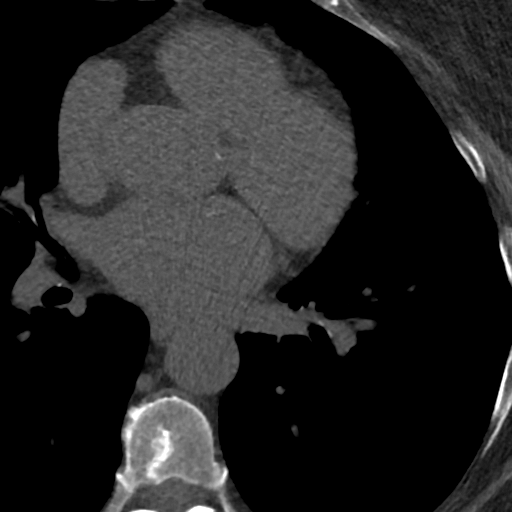
[im 36/45  vessel]
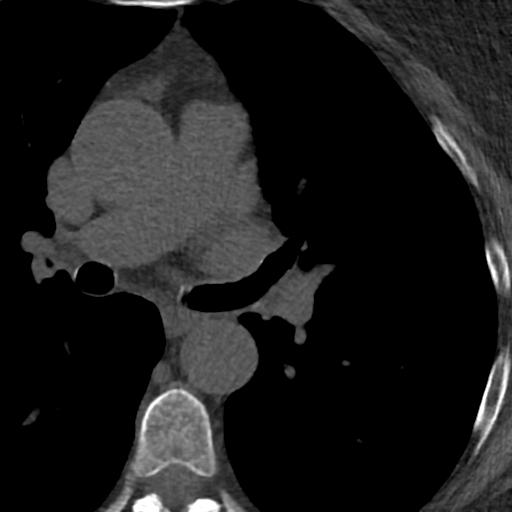

[Series 3: lung 72 % · axial · 0.68mm/px · z∈[-236,-150]mm · 5 of 45 slices shown]
[im 8/45  lung]
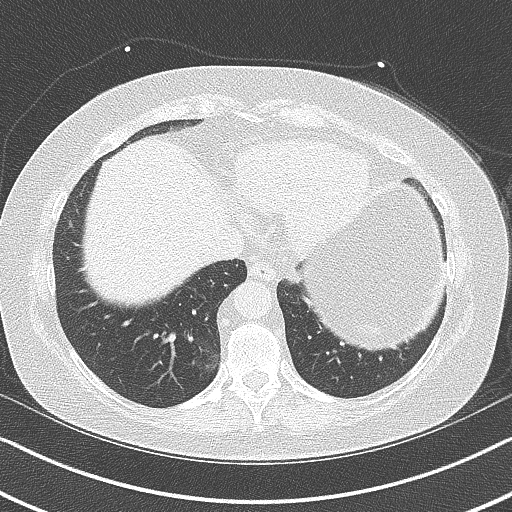
[im 15/45  lung]
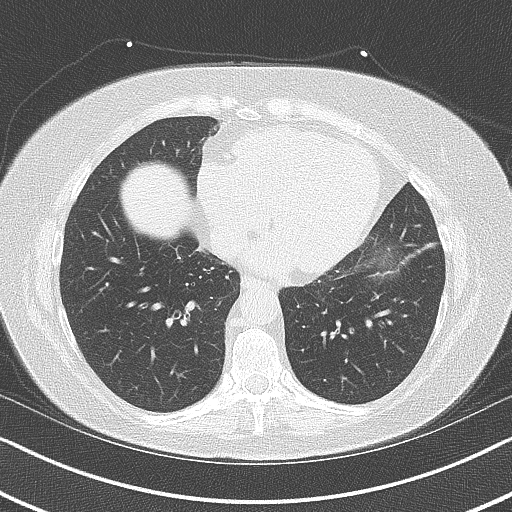
[im 23/45  lung]
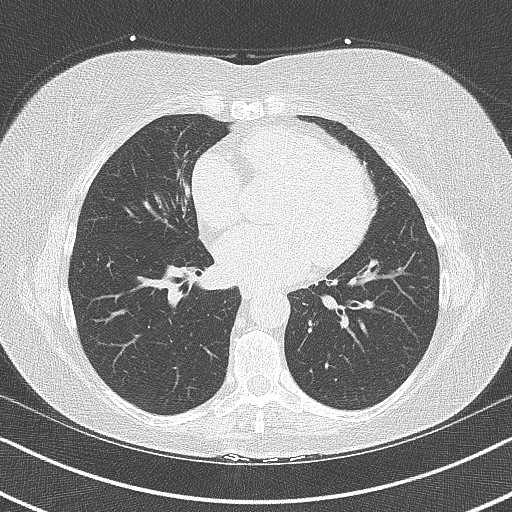
[im 30/45  lung]
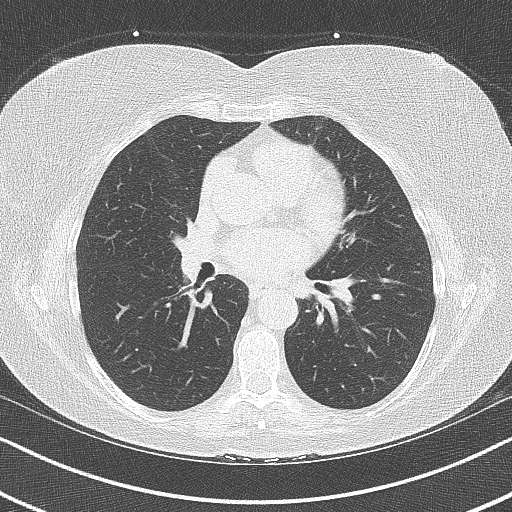
[im 37/45  lung]
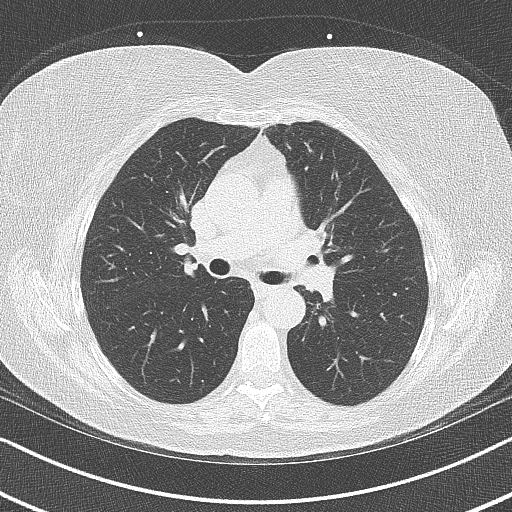

[Series 4: lung st 72 % · axial · 0.68mm/px · z∈[-236,-150]mm · 5 of 45 slices shown]
[im 8/45  lung]
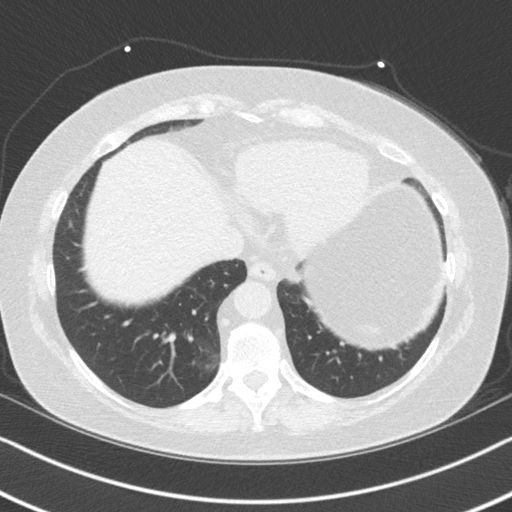
[im 15/45  lung]
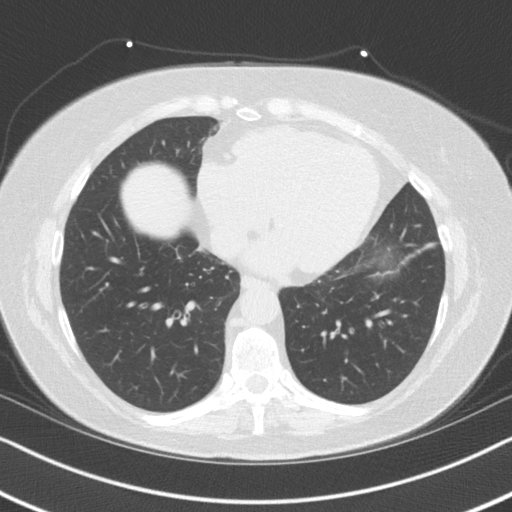
[im 23/45  lung]
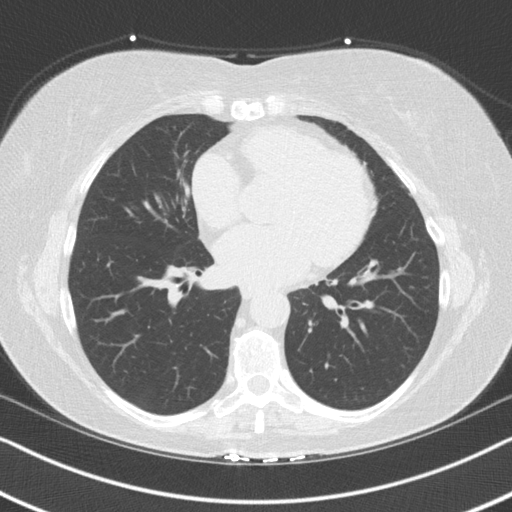
[im 30/45  lung]
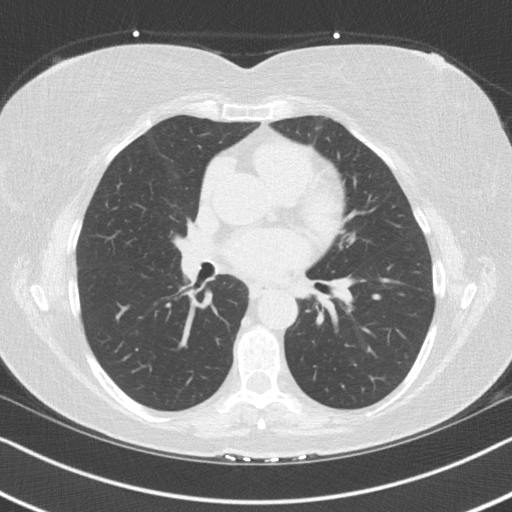
[im 37/45  lung]
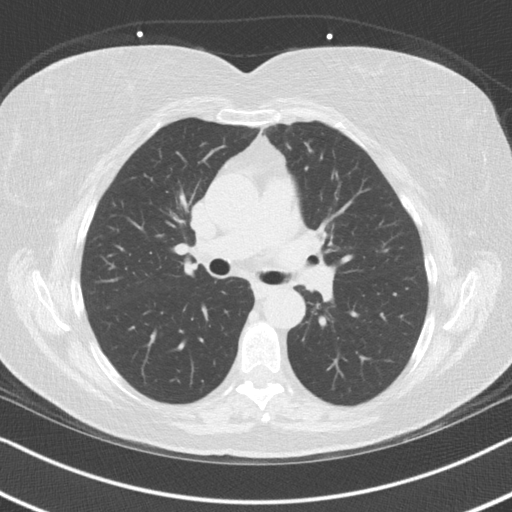

[14 of 20 positions shown; findings below may reference images not displayed]

FINDINGS: Mild scarring in the medial segment of the right middle lobe. Within
the visualized portions of the thorax there are no suspicious
appearing pulmonary nodules or masses, there is no acute
consolidative airspace disease, no pleural effusions, no
pneumothorax and no lymphadenopathy. Visualized portions of the
upper abdomen are unremarkable. There are no aggressive appearing
lytic or blastic lesions noted in the visualized portions of the
skeleton.
IMPRESSION: No significant incidental noncardiac findings are noted.
FINDINGS: Non-cardiac: See separate report from [REDACTED].

Ascending Aorta: 3.7 cm, borderline dilated.

Pericardium: Normal
IMPRESSION: Coronary calcium score of 7.4. This was 56th percentile for age and
sex matched control, suggesting intermediate risk for future cardiac
events.

Gen Herr

*** End of Addendum ***
EXAM:
OVER-READ INTERPRETATION  CT CHEST

The following report is an over-read performed by radiologist Dr.
Jhan Carlo Neuque [REDACTED] on 12/29/2019. This
over-read does not include interpretation of cardiac or coronary
anatomy or pathology. The coronary calcium score/coronary CTA
interpretation by the cardiologist is attached.
FINDINGS: Mild scarring in the medial segment of the right middle lobe. Within
the visualized portions of the thorax there are no suspicious
appearing pulmonary nodules or masses, there is no acute
consolidative airspace disease, no pleural effusions, no
pneumothorax and no lymphadenopathy. Visualized portions of the
upper abdomen are unremarkable. There are no aggressive appearing
lytic or blastic lesions noted in the visualized portions of the
skeleton.
IMPRESSION: No significant incidental noncardiac findings are noted.

## 2020-05-22 ENCOUNTER — Other Ambulatory Visit: Payer: Medicare Other

## 2020-05-22 ENCOUNTER — Other Ambulatory Visit: Payer: Self-pay

## 2020-05-22 DIAGNOSIS — E785 Hyperlipidemia, unspecified: Secondary | ICD-10-CM | POA: Diagnosis not present

## 2020-05-22 LAB — LIPID PANEL
Chol/HDL Ratio: 3 ratio (ref 0.0–4.4)
Cholesterol, Total: 201 mg/dL — ABNORMAL HIGH (ref 100–199)
HDL: 66 mg/dL (ref >39)
LDL Chol Calc (NIH): 126 mg/dL — ABNORMAL HIGH (ref 0–99)
Triglycerides: 48 mg/dL (ref 0–149)
VLDL Cholesterol Cal: 9 mg/dL (ref 5–40)

## 2020-05-22 LAB — HEPATIC FUNCTION PANEL
ALT: 17 IU/L (ref 0–32)
AST: 23 IU/L (ref 0–40)
Albumin: 4.6 g/dL (ref 3.8–4.8)
Alkaline Phosphatase: 79 IU/L (ref 48–121)
Bilirubin Total: 0.4 mg/dL (ref 0.0–1.2)
Bilirubin, Direct: 0.13 mg/dL (ref 0.00–0.40)
Total Protein: 6.8 g/dL (ref 6.0–8.5)

## 2020-05-23 ENCOUNTER — Telehealth: Payer: Self-pay | Admitting: Pharmacist

## 2020-05-23 NOTE — Telephone Encounter (Addendum)
Patient called regarding lipid panel.  Is not on any lipid lowering meds.  Is intolerant to statins and Praluent caused leg cramps.  Is scared to take Nexlizet because she read it can cause tendon rupture. Does not wish to try ezetimibe monotherapy but will let us know if she changes her mind.  LDL decreased from 174 to 126 which is much improved but still above goal of <70.  Advised patient her level is still above goal and patient understands.  She would continue to still practice lifestyle changes rather than pharmacologic therapy.  Recommended continued exercise and eating healthy fats and vegetables while avoiding saturated fats.  Patient voiced understanding.

## 2020-05-24 NOTE — Addendum Note (Signed)
Addended by: Trina Asch E on: 05/24/2020 02:51 PM   Modules accepted: Orders

## 2020-06-06 ENCOUNTER — Telehealth: Payer: Self-pay

## 2020-06-06 NOTE — Telephone Encounter (Signed)
Patient is calling in regards to wanting a letter from Dr. Quincy Simmonds regarding why it is necessary for patient to have AEX every year. Patient stated "this letter is to help appeal insurance claim and have insurance pay for AEX every year".   Routing to triage for further assistance.

## 2020-06-06 NOTE — Telephone Encounter (Signed)
Message to business office for return call.   Encounter closed.  

## 2020-06-12 ENCOUNTER — Telehealth: Payer: Self-pay | Admitting: Obstetrics and Gynecology

## 2020-06-12 NOTE — Telephone Encounter (Signed)
Patient is calling to follow up on an appeal letter for Medicare.

## 2020-06-12 NOTE — Telephone Encounter (Signed)
Return call to patient. Reviewed Well- Woman care is not a covered benefit with Medicare and breast/pelvic exam is covered every two years. Patient is concerned about history of Lichen Sclerosus and family hx of breast cancer.   Advised message has been sent to provider for review and she will be notified of outcome.   See Account note.

## 2020-06-12 NOTE — Telephone Encounter (Signed)
Routing to [place], Therapist, sports for follow up

## 2020-06-13 NOTE — Telephone Encounter (Signed)
Patient was seen this year for her well woman visit with gynecologic exam.  If she chooses not to do well woman visits with [place] in the future, she may be seen for problem visits if that is appropriate.

## 2020-06-14 DIAGNOSIS — R7303 Prediabetes: Secondary | ICD-10-CM | POA: Diagnosis not present

## 2020-06-14 DIAGNOSIS — Z0000 Encounter for general adult medical examination without abnormal findings: Secondary | ICD-10-CM | POA: Diagnosis not present

## 2020-06-14 DIAGNOSIS — Z23 Encounter for immunization: Secondary | ICD-10-CM | POA: Diagnosis not present

## 2020-06-14 DIAGNOSIS — J4520 Mild intermittent asthma, uncomplicated: Secondary | ICD-10-CM | POA: Diagnosis not present

## 2020-06-14 DIAGNOSIS — E559 Vitamin D deficiency, unspecified: Secondary | ICD-10-CM | POA: Diagnosis not present

## 2020-06-14 DIAGNOSIS — E785 Hyperlipidemia, unspecified: Secondary | ICD-10-CM | POA: Diagnosis not present

## 2020-06-14 NOTE — Telephone Encounter (Signed)
Call back to patient. Left message to speak with Gay Filler or Alinda Sierras.

## 2020-06-18 NOTE — Telephone Encounter (Signed)
Patient is returning call to [facility].

## 2020-06-18 NOTE — Telephone Encounter (Signed)
Return call to patient. Voice mail confirms" Peggy and DPR indicates can leave message on voice mail. Left message that have reviewed request with admin and unable to assist with letter but can have problem visit option or well visit option for future visits.  Left message to call back if additional concerns.

## 2020-06-18 NOTE — Telephone Encounter (Signed)
Patient is returning call to Gay Filler or Alinda Sierras.

## 2020-06-21 NOTE — Telephone Encounter (Signed)
Call from patient. Will have preventive visit with PCP and would like to change currently scheduled appointment to recheck of Lichen sclerosis and breast/pellvic. Aware Medicare only covers breast/pelvic every two years and ABN will be needed. Routine well care will be with PCP.   Encounter closed.

## 2020-06-28 ENCOUNTER — Other Ambulatory Visit: Payer: Self-pay

## 2020-06-28 DIAGNOSIS — L900 Lichen sclerosus et atrophicus: Secondary | ICD-10-CM

## 2020-06-28 NOTE — Telephone Encounter (Signed)
Patient is calling in regards to a refill for Temovate.

## 2020-06-28 NOTE — Telephone Encounter (Signed)
Medication refill request: Temovate Last AEX:  03/01/20 Dr. Quincy Simmonds  Next AEX: 03/05/21 Last MMG (if hormonal medication request): n/a Refill authorized: today, please advise

## 2020-06-29 DIAGNOSIS — Z23 Encounter for immunization: Secondary | ICD-10-CM | POA: Diagnosis not present

## 2020-06-29 MED ORDER — CLOBETASOL PROPIONATE 0.05 % EX CREA: TOPICAL_CREAM | CUTANEOUS | 0 refills | Status: DC

## 2020-07-04 DIAGNOSIS — M79672 Pain in left foot: Secondary | ICD-10-CM | POA: Diagnosis not present

## 2020-07-06 ENCOUNTER — Telehealth: Payer: Self-pay

## 2020-07-06 DIAGNOSIS — L900 Lichen sclerosus et atrophicus: Secondary | ICD-10-CM

## 2020-07-06 DIAGNOSIS — M7662 Achilles tendinitis, left leg: Secondary | ICD-10-CM | POA: Diagnosis not present

## 2020-07-06 MED ORDER — CLOBETASOL PROPIONATE 0.05 % EX CREA: TOPICAL_CREAM | CUTANEOUS | 0 refills | Status: DC

## 2020-07-06 NOTE — Telephone Encounter (Signed)
Spoke with pt. Pt states current Rx clobetasol cream 0.05% with insurance was cost prohibitive. Pt advised can use Good Rx coupon and pay out of pocket. Pt agreeable.  New Rx sent to [facility] on [address] per pt's request so can use coupon.  Good Rx coupon printed and placed at front office for pt to pick up today.  Pt thankful for coupon.  Encounter closed.  Routing to Dr Quincy Simmonds for review.

## 2020-07-06 NOTE — Telephone Encounter (Signed)
Patient is calling in regards to medication refill of Temovate. Patient states pharmacy sent a request to switch to an alternate prescription.

## 2020-07-11 DIAGNOSIS — M7662 Achilles tendinitis, left leg: Secondary | ICD-10-CM | POA: Diagnosis not present

## 2020-07-16 DIAGNOSIS — M7662 Achilles tendinitis, left leg: Secondary | ICD-10-CM | POA: Diagnosis not present

## 2020-07-19 DIAGNOSIS — M7662 Achilles tendinitis, left leg: Secondary | ICD-10-CM | POA: Diagnosis not present

## 2020-07-23 DIAGNOSIS — M7662 Achilles tendinitis, left leg: Secondary | ICD-10-CM | POA: Diagnosis not present

## 2020-07-25 DIAGNOSIS — M7662 Achilles tendinitis, left leg: Secondary | ICD-10-CM | POA: Diagnosis not present

## 2020-07-27 DIAGNOSIS — M7662 Achilles tendinitis, left leg: Secondary | ICD-10-CM | POA: Diagnosis not present

## 2020-07-30 DIAGNOSIS — M7662 Achilles tendinitis, left leg: Secondary | ICD-10-CM | POA: Diagnosis not present

## 2020-08-01 DIAGNOSIS — M7662 Achilles tendinitis, left leg: Secondary | ICD-10-CM | POA: Diagnosis not present

## 2020-08-03 DIAGNOSIS — M79672 Pain in left foot: Secondary | ICD-10-CM | POA: Diagnosis not present

## 2020-08-03 DIAGNOSIS — M7662 Achilles tendinitis, left leg: Secondary | ICD-10-CM | POA: Diagnosis not present

## 2020-08-06 DIAGNOSIS — M7662 Achilles tendinitis, left leg: Secondary | ICD-10-CM | POA: Diagnosis not present

## 2020-08-08 DIAGNOSIS — M7662 Achilles tendinitis, left leg: Secondary | ICD-10-CM | POA: Diagnosis not present

## 2020-08-10 DIAGNOSIS — M7662 Achilles tendinitis, left leg: Secondary | ICD-10-CM | POA: Diagnosis not present

## 2020-08-13 DIAGNOSIS — M7662 Achilles tendinitis, left leg: Secondary | ICD-10-CM | POA: Diagnosis not present

## 2020-08-15 DIAGNOSIS — M7662 Achilles tendinitis, left leg: Secondary | ICD-10-CM | POA: Diagnosis not present

## 2020-08-20 DIAGNOSIS — M542 Cervicalgia: Secondary | ICD-10-CM | POA: Diagnosis not present

## 2020-08-20 DIAGNOSIS — M25512 Pain in left shoulder: Secondary | ICD-10-CM | POA: Diagnosis not present

## 2020-08-20 DIAGNOSIS — M25511 Pain in right shoulder: Secondary | ICD-10-CM | POA: Diagnosis not present

## 2020-08-22 DIAGNOSIS — H18603 Keratoconus, unspecified, bilateral: Secondary | ICD-10-CM | POA: Diagnosis not present

## 2020-08-22 DIAGNOSIS — H52203 Unspecified astigmatism, bilateral: Secondary | ICD-10-CM | POA: Diagnosis not present

## 2020-08-22 DIAGNOSIS — Z961 Presence of intraocular lens: Secondary | ICD-10-CM | POA: Diagnosis not present

## 2020-08-28 DIAGNOSIS — S46912D Strain of unspecified muscle, fascia and tendon at shoulder and upper arm level, left arm, subsequent encounter: Secondary | ICD-10-CM | POA: Diagnosis not present

## 2020-08-28 DIAGNOSIS — M62838 Other muscle spasm: Secondary | ICD-10-CM | POA: Diagnosis not present

## 2020-08-28 DIAGNOSIS — S46911D Strain of unspecified muscle, fascia and tendon at shoulder and upper arm level, right arm, subsequent encounter: Secondary | ICD-10-CM | POA: Diagnosis not present

## 2020-08-28 DIAGNOSIS — M6289 Other specified disorders of muscle: Secondary | ICD-10-CM | POA: Diagnosis not present

## 2020-09-14 DIAGNOSIS — M6289 Other specified disorders of muscle: Secondary | ICD-10-CM | POA: Diagnosis not present

## 2020-09-14 DIAGNOSIS — S46911D Strain of unspecified muscle, fascia and tendon at shoulder and upper arm level, right arm, subsequent encounter: Secondary | ICD-10-CM | POA: Diagnosis not present

## 2020-09-14 DIAGNOSIS — M62838 Other muscle spasm: Secondary | ICD-10-CM | POA: Diagnosis not present

## 2020-09-14 DIAGNOSIS — S46912D Strain of unspecified muscle, fascia and tendon at shoulder and upper arm level, left arm, subsequent encounter: Secondary | ICD-10-CM | POA: Diagnosis not present

## 2020-09-17 DIAGNOSIS — M62838 Other muscle spasm: Secondary | ICD-10-CM | POA: Diagnosis not present

## 2020-09-17 DIAGNOSIS — M6289 Other specified disorders of muscle: Secondary | ICD-10-CM | POA: Diagnosis not present

## 2020-09-17 DIAGNOSIS — S46912D Strain of unspecified muscle, fascia and tendon at shoulder and upper arm level, left arm, subsequent encounter: Secondary | ICD-10-CM | POA: Diagnosis not present

## 2020-09-17 DIAGNOSIS — S46911D Strain of unspecified muscle, fascia and tendon at shoulder and upper arm level, right arm, subsequent encounter: Secondary | ICD-10-CM | POA: Diagnosis not present

## 2020-09-20 DIAGNOSIS — S46912D Strain of unspecified muscle, fascia and tendon at shoulder and upper arm level, left arm, subsequent encounter: Secondary | ICD-10-CM | POA: Diagnosis not present

## 2020-09-20 DIAGNOSIS — M6289 Other specified disorders of muscle: Secondary | ICD-10-CM | POA: Diagnosis not present

## 2020-09-20 DIAGNOSIS — M62838 Other muscle spasm: Secondary | ICD-10-CM | POA: Diagnosis not present

## 2020-09-20 DIAGNOSIS — S46911D Strain of unspecified muscle, fascia and tendon at shoulder and upper arm level, right arm, subsequent encounter: Secondary | ICD-10-CM | POA: Diagnosis not present

## 2020-09-24 DIAGNOSIS — M62838 Other muscle spasm: Secondary | ICD-10-CM | POA: Diagnosis not present

## 2020-09-24 DIAGNOSIS — M6289 Other specified disorders of muscle: Secondary | ICD-10-CM | POA: Diagnosis not present

## 2020-09-24 DIAGNOSIS — M542 Cervicalgia: Secondary | ICD-10-CM | POA: Diagnosis not present

## 2020-09-24 DIAGNOSIS — S46912D Strain of unspecified muscle, fascia and tendon at shoulder and upper arm level, left arm, subsequent encounter: Secondary | ICD-10-CM | POA: Diagnosis not present

## 2020-09-24 DIAGNOSIS — S46911D Strain of unspecified muscle, fascia and tendon at shoulder and upper arm level, right arm, subsequent encounter: Secondary | ICD-10-CM | POA: Diagnosis not present

## 2020-09-27 DIAGNOSIS — M62838 Other muscle spasm: Secondary | ICD-10-CM | POA: Diagnosis not present

## 2020-09-27 DIAGNOSIS — S46912D Strain of unspecified muscle, fascia and tendon at shoulder and upper arm level, left arm, subsequent encounter: Secondary | ICD-10-CM | POA: Diagnosis not present

## 2020-09-27 DIAGNOSIS — M6289 Other specified disorders of muscle: Secondary | ICD-10-CM | POA: Diagnosis not present

## 2020-09-27 DIAGNOSIS — S46911D Strain of unspecified muscle, fascia and tendon at shoulder and upper arm level, right arm, subsequent encounter: Secondary | ICD-10-CM | POA: Diagnosis not present

## 2020-10-01 DIAGNOSIS — M62838 Other muscle spasm: Secondary | ICD-10-CM | POA: Diagnosis not present

## 2020-10-01 DIAGNOSIS — S46911D Strain of unspecified muscle, fascia and tendon at shoulder and upper arm level, right arm, subsequent encounter: Secondary | ICD-10-CM | POA: Diagnosis not present

## 2020-10-01 DIAGNOSIS — M6289 Other specified disorders of muscle: Secondary | ICD-10-CM | POA: Diagnosis not present

## 2020-10-01 DIAGNOSIS — S46912D Strain of unspecified muscle, fascia and tendon at shoulder and upper arm level, left arm, subsequent encounter: Secondary | ICD-10-CM | POA: Diagnosis not present

## 2020-10-08 DIAGNOSIS — M62838 Other muscle spasm: Secondary | ICD-10-CM | POA: Diagnosis not present

## 2020-10-08 DIAGNOSIS — M6289 Other specified disorders of muscle: Secondary | ICD-10-CM | POA: Diagnosis not present

## 2020-10-08 DIAGNOSIS — S46912D Strain of unspecified muscle, fascia and tendon at shoulder and upper arm level, left arm, subsequent encounter: Secondary | ICD-10-CM | POA: Diagnosis not present

## 2020-10-08 DIAGNOSIS — S46911D Strain of unspecified muscle, fascia and tendon at shoulder and upper arm level, right arm, subsequent encounter: Secondary | ICD-10-CM | POA: Diagnosis not present

## 2020-10-24 DIAGNOSIS — S46911D Strain of unspecified muscle, fascia and tendon at shoulder and upper arm level, right arm, subsequent encounter: Secondary | ICD-10-CM | POA: Diagnosis not present

## 2020-10-24 DIAGNOSIS — S46912D Strain of unspecified muscle, fascia and tendon at shoulder and upper arm level, left arm, subsequent encounter: Secondary | ICD-10-CM | POA: Diagnosis not present

## 2020-10-24 DIAGNOSIS — M6289 Other specified disorders of muscle: Secondary | ICD-10-CM | POA: Diagnosis not present

## 2020-10-24 DIAGNOSIS — M62838 Other muscle spasm: Secondary | ICD-10-CM | POA: Diagnosis not present

## 2020-10-29 DIAGNOSIS — Z9189 Other specified personal risk factors, not elsewhere classified: Secondary | ICD-10-CM | POA: Diagnosis not present

## 2020-10-29 DIAGNOSIS — Z20822 Contact with and (suspected) exposure to covid-19: Secondary | ICD-10-CM | POA: Diagnosis not present

## 2020-11-14 DIAGNOSIS — J4520 Mild intermittent asthma, uncomplicated: Secondary | ICD-10-CM | POA: Diagnosis not present

## 2020-11-22 DIAGNOSIS — M6289 Other specified disorders of muscle: Secondary | ICD-10-CM | POA: Diagnosis not present

## 2020-11-22 DIAGNOSIS — S46911D Strain of unspecified muscle, fascia and tendon at shoulder and upper arm level, right arm, subsequent encounter: Secondary | ICD-10-CM | POA: Diagnosis not present

## 2020-11-22 DIAGNOSIS — S46912D Strain of unspecified muscle, fascia and tendon at shoulder and upper arm level, left arm, subsequent encounter: Secondary | ICD-10-CM | POA: Diagnosis not present

## 2020-11-22 DIAGNOSIS — M62838 Other muscle spasm: Secondary | ICD-10-CM | POA: Diagnosis not present

## 2020-12-19 DIAGNOSIS — S46911D Strain of unspecified muscle, fascia and tendon at shoulder and upper arm level, right arm, subsequent encounter: Secondary | ICD-10-CM | POA: Diagnosis not present

## 2020-12-19 DIAGNOSIS — M62838 Other muscle spasm: Secondary | ICD-10-CM | POA: Diagnosis not present

## 2020-12-19 DIAGNOSIS — S46912D Strain of unspecified muscle, fascia and tendon at shoulder and upper arm level, left arm, subsequent encounter: Secondary | ICD-10-CM | POA: Diagnosis not present

## 2020-12-19 DIAGNOSIS — M6289 Other specified disorders of muscle: Secondary | ICD-10-CM | POA: Diagnosis not present

## 2021-01-16 DIAGNOSIS — J4520 Mild intermittent asthma, uncomplicated: Secondary | ICD-10-CM | POA: Diagnosis not present

## 2021-01-16 DIAGNOSIS — U071 COVID-19: Secondary | ICD-10-CM | POA: Diagnosis not present

## 2021-01-17 ENCOUNTER — Other Ambulatory Visit (HOSPITAL_BASED_OUTPATIENT_CLINIC_OR_DEPARTMENT_OTHER): Payer: Self-pay

## 2021-01-17 DIAGNOSIS — U071 COVID-19: Secondary | ICD-10-CM | POA: Diagnosis not present

## 2021-01-17 MED ORDER — PAXLOVID 20 X 150 MG & 10 X 100MG PO TBPK
ORAL_TABLET | ORAL | 0 refills | Status: DC
  Filled 2021-01-17: qty 30, 5d supply, fill #0

## 2021-01-29 DIAGNOSIS — M6289 Other specified disorders of muscle: Secondary | ICD-10-CM | POA: Diagnosis not present

## 2021-01-29 DIAGNOSIS — M62838 Other muscle spasm: Secondary | ICD-10-CM | POA: Diagnosis not present

## 2021-01-29 DIAGNOSIS — S46912D Strain of unspecified muscle, fascia and tendon at shoulder and upper arm level, left arm, subsequent encounter: Secondary | ICD-10-CM | POA: Diagnosis not present

## 2021-01-29 DIAGNOSIS — S46911D Strain of unspecified muscle, fascia and tendon at shoulder and upper arm level, right arm, subsequent encounter: Secondary | ICD-10-CM | POA: Diagnosis not present

## 2021-02-21 DIAGNOSIS — S46912D Strain of unspecified muscle, fascia and tendon at shoulder and upper arm level, left arm, subsequent encounter: Secondary | ICD-10-CM | POA: Diagnosis not present

## 2021-02-21 DIAGNOSIS — M62838 Other muscle spasm: Secondary | ICD-10-CM | POA: Diagnosis not present

## 2021-02-21 DIAGNOSIS — M6289 Other specified disorders of muscle: Secondary | ICD-10-CM | POA: Diagnosis not present

## 2021-02-21 DIAGNOSIS — S46911D Strain of unspecified muscle, fascia and tendon at shoulder and upper arm level, right arm, subsequent encounter: Secondary | ICD-10-CM | POA: Diagnosis not present

## 2021-03-05 ENCOUNTER — Ambulatory Visit: Payer: Medicare Other | Admitting: Obstetrics and Gynecology

## 2021-03-19 DIAGNOSIS — J4520 Mild intermittent asthma, uncomplicated: Secondary | ICD-10-CM | POA: Diagnosis not present

## 2021-03-28 DIAGNOSIS — M6289 Other specified disorders of muscle: Secondary | ICD-10-CM | POA: Diagnosis not present

## 2021-03-28 DIAGNOSIS — S46912D Strain of unspecified muscle, fascia and tendon at shoulder and upper arm level, left arm, subsequent encounter: Secondary | ICD-10-CM | POA: Diagnosis not present

## 2021-03-28 DIAGNOSIS — M62838 Other muscle spasm: Secondary | ICD-10-CM | POA: Diagnosis not present

## 2021-03-28 DIAGNOSIS — S46911D Strain of unspecified muscle, fascia and tendon at shoulder and upper arm level, right arm, subsequent encounter: Secondary | ICD-10-CM | POA: Diagnosis not present

## 2021-03-29 ENCOUNTER — Encounter (HOSPITAL_BASED_OUTPATIENT_CLINIC_OR_DEPARTMENT_OTHER): Payer: Self-pay

## 2021-03-29 ENCOUNTER — Telehealth (HOSPITAL_BASED_OUTPATIENT_CLINIC_OR_DEPARTMENT_OTHER): Payer: Self-pay

## 2021-03-29 NOTE — Telephone Encounter (Signed)
Christy Rojas is a 70 y.o. female was called and contacted re: New pt Pre appt call to collect history information. -Allergy -Medication -Confirm pharmacy -OB history   Pt was available.Chart was updated. Pt was notified to arrive 15 min early and we will need a urine sample when she arrives. Pt verbalized understanding.

## 2021-04-04 ENCOUNTER — Ambulatory Visit (HOSPITAL_BASED_OUTPATIENT_CLINIC_OR_DEPARTMENT_OTHER): Payer: Medicare Other | Admitting: Obstetrics & Gynecology

## 2021-04-04 ENCOUNTER — Other Ambulatory Visit: Payer: Self-pay

## 2021-04-04 ENCOUNTER — Encounter (HOSPITAL_BASED_OUTPATIENT_CLINIC_OR_DEPARTMENT_OTHER): Payer: Self-pay | Admitting: Obstetrics & Gynecology

## 2021-04-04 VITALS — BP 117/67 | HR 68 | Ht 60.5 in | Wt 166.4 lb

## 2021-04-04 DIAGNOSIS — L900 Lichen sclerosus et atrophicus: Secondary | ICD-10-CM

## 2021-04-04 DIAGNOSIS — Z90710 Acquired absence of both cervix and uterus: Secondary | ICD-10-CM

## 2021-04-04 DIAGNOSIS — Z780 Asymptomatic menopausal state: Secondary | ICD-10-CM

## 2021-04-04 MED ORDER — CLOBETASOL PROPIONATE 0.05 % EX CREA: TOPICAL_CREAM | CUTANEOUS | 0 refills | Status: DC

## 2021-04-04 MED ORDER — MOMETASONE FUROATE 0.1 % EX OINT: TOPICAL_OINTMENT | CUTANEOUS | 3 refills | Status: DC

## 2021-04-04 NOTE — Progress Notes (Signed)
70 y.o. G1P1 Married White or Caucasian female here for establishing care/new patient exam.  Denies vaginal bleeding.  She is Lauren Murphy's mother.    H/o hysterectomy.  Had vaginal cuff thickening in the past.  Saw gyn/oncology and had evaluation.  Has biopsy proven lichen sclerosus.  Changes in treatment and mometasone ointment discussed.    No LMP recorded (lmp unknown). Patient has had a hysterectomy.          Sexually active: No.  The current method of family planning is status post hysterectomy.    Exercising: Yes.     Tennis several times weekly Smoker:  no  Health Maintenance: Pap:  2013 negative History of abnormal Pap:  yes, years ago MMG:  05/10/2020 Colonoscopy:  04/2017, Dr. Cristina Gong, follow up 10 years BMD:   2019, osteopenia TDaP:  2015 Pneumonia vaccine(s):  completed with PCP Shingrix:   completed Screening Labs: does with Dr. Orland Mustard   reports that she has never smoked. She has never used smokeless tobacco. She reports current alcohol use of about 1.0 standard drink of alcohol per week. She reports that she does not use drugs.  Past Medical History:  Diagnosis Date   Asthma    Endometriosis    resolved by TAH   Hyperlipidemia    Lichen sclerosus of female genitalia 2020   Rectal fissure 12/97    Past Surgical History:  Procedure Laterality Date   ABDOMINAL HYSTERECTOMY     TONSILLECTOMY AND ADENOIDECTOMY  1961    Current Outpatient Medications  Medication Sig Dispense Refill   ADVAIR DISKUS 100-50 MCG/DOSE AEPB      Cetirizine HCl (ZYRTEC PO) Take by mouth as needed.     clobetasol cream (TEMOVATE) 0.05 % Apply thinly to affected area twice daily x 2 weeks if needed. Then use twice weekly if needed for maintenance. 60 g 0   Coenzyme Q10 (COQ10 PO) Take 1 capsule by mouth daily.     Cyanocobalamin (VITAMIN B-12 PO) Take 1 tablet by mouth as needed.      diclofenac sodium (VOLTAREN) 1 % GEL Apply 2 g topically as directed. 300 Tube 5   esomeprazole  (NEXIUM) 40 MG capsule Take 20 mg by mouth daily before breakfast. Reported on 12/04/2015     fexofenadine (ALLEGRA) 180 MG tablet Take 180 mg by mouth daily.     mometasone (NASONEX) 50 MCG/ACT nasal spray Place 2 sprays into the nose daily.     montelukast (SINGULAIR) 10 MG tablet Take 10 mg by mouth at bedtime.       Omega-3 Fatty Acids (FISH OIL PO) Take 1 capsule by mouth daily.       Multiple Vitamins-Minerals (MULTIVITAMINS THER. W/MINERALS) TABS Take 1 tablet by mouth daily.   (Patient not taking: Reported on 04/04/2021)     nystatin (MYCOSTATIN/NYSTOP) powder Apply 1 application topically 3 (three) times daily. Apply to affected area for up to 7 days (Patient not taking: Reported on 04/04/2021) 30 g 3   OVER THE COUNTER MEDICATION Nutrim, Takes 1 tablet daily for cholesterol     predniSONE (DELTASONE) 20 MG tablet Take 20 mg by mouth as directed. (Patient not taking: Reported on 04/04/2021)     No current facility-administered medications for this visit.    Family History  Problem Relation Age of Onset   Breast cancer Mother 61    Review of Systems  Constitutional: Negative.   Gastrointestinal: Negative.   Genitourinary: Negative.    Exam:   BP 117/67 (BP  Location: Right Leg, Patient Position: Sitting, Cuff Size: Large)   Pulse 68   Ht 5' 0.5" (1.537 m)   Wt 166 lb 6.4 oz (75.5 kg)   LMP  (LMP Unknown)   BMI 31.96 kg/m   Height: 5' 0.5" (153.7 cm)  General appearance: alert, cooperative and appears stated age Head: Normocephalic, without obvious abnormality, atraumatic Neck: no adenopathy, supple, symmetrical, trachea midline and thyroid normal to inspection and palpation Lungs: clear to auscultation bilaterally Breasts: normal appearance, no masses or tenderness Heart: regular rate and rhythm Abdomen: soft, non-tender; bowel sounds normal; no masses,  no organomegaly Extremities: extremities normal, atraumatic, no cyanosis or edema Skin: Skin color, texture, turgor  normal. No rashes or lesions Lymph nodes: Cervical, supraclavicular, and axillary nodes normal. No abnormal inguinal nodes palpated Neurologic: Grossly normal   Pelvic: External genitalia:  no lesions              Urethra:  normal appearing urethra with no masses, tenderness or lesions              Bartholins and Skenes: normal                 Vagina: normal appearing vagina with normal color and no discharge, no lesions              Cervix: absent              Pap taken: No. Bimanual Exam:  Uterus:  uterus absent              Adnexa: no mass, fullness, tenderness               Rectovaginal: Confirms               Anus:  normal sphincter tone, no lesions  Chaperone, Octaviano Batty, CMA, was present for exam.  Assessment/Plan: 1. Postmenopausal - no HRT  2. 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  Dispense: 60 g; Refill: 0  3. H/O abdominal hysterectomy with BSO

## 2021-04-23 DIAGNOSIS — S46911D Strain of unspecified muscle, fascia and tendon at shoulder and upper arm level, right arm, subsequent encounter: Secondary | ICD-10-CM | POA: Diagnosis not present

## 2021-04-23 DIAGNOSIS — M62838 Other muscle spasm: Secondary | ICD-10-CM | POA: Diagnosis not present

## 2021-04-23 DIAGNOSIS — M6289 Other specified disorders of muscle: Secondary | ICD-10-CM | POA: Diagnosis not present

## 2021-04-23 DIAGNOSIS — S46912D Strain of unspecified muscle, fascia and tendon at shoulder and upper arm level, left arm, subsequent encounter: Secondary | ICD-10-CM | POA: Diagnosis not present

## 2021-05-28 ENCOUNTER — Encounter: Payer: Self-pay | Admitting: Allergy and Immunology

## 2021-05-28 ENCOUNTER — Ambulatory Visit: Payer: Medicare Other | Admitting: Allergy and Immunology

## 2021-05-28 ENCOUNTER — Other Ambulatory Visit: Payer: Self-pay

## 2021-05-28 VITALS — BP 122/80 | HR 67 | Temp 97.6°F | Resp 14 | Ht 61.0 in | Wt 162.4 lb

## 2021-05-28 DIAGNOSIS — K219 Gastro-esophageal reflux disease without esophagitis: Secondary | ICD-10-CM | POA: Diagnosis not present

## 2021-05-28 DIAGNOSIS — J301 Allergic rhinitis due to pollen: Secondary | ICD-10-CM

## 2021-05-28 DIAGNOSIS — J3089 Other allergic rhinitis: Secondary | ICD-10-CM

## 2021-05-28 DIAGNOSIS — J4540 Moderate persistent asthma, uncomplicated: Secondary | ICD-10-CM | POA: Diagnosis not present

## 2021-05-28 NOTE — Progress Notes (Signed)
[place] [facility] [place] - [place] - [place] - [place]   Dear Dr. Orland Mustard,  Thank you for referring Christy Rojas to the [facility] on 05/28/2021.   Below is a summation of this patient's evaluation and recommendations.  Thank you for your referral. I will keep you informed about this patient's response to treatment.   If you have any questions please do not hesitate to contact me.   Sincerely,  Jiles Prows, MD Allergy / Immunology [facility]   ______________________________________________________________________    NEW PATIENT NOTE  Referring Provider: London Pepper, MD Primary Provider: London Pepper, MD Date of office visit: 05/28/2021    Subjective:   Chief Complaint:  Christy Rojas (DOB: Sep 29, 1951) is a 70 y.o. female who presents to the clinic on 05/28/2021 with a chief complaint of Establish Care, Asthma (Worse since had covid in April), and Wheezing .     HPI: Christy Rojas presents to this clinic in evaluation of allergies and asthma.  She has a long history of allergies and asthma dating back to childhood for which she received immunotherapy before her teenage years and she apparently did relatively well and felt as though she had this under pretty good control while intermittently using Advair and consistently using a nasal steroid and montelukast.  She can play tennis without any problem and she rarely uses a short acting bronchodilator.  She did have a little flare of her nasal issue usually during the spring and fall but she felt that everything was manageable.  She only uses her Advair intermittently for she uses her Advair on a consistent basis she gets hoarseness.  She also appears to have lots of throat clearing and sometimes has a glob in her throat.  She does have reflux disease that she treats with Nexium twice a week.  She has 1 coffee in the morning and  sometimes a Coke Zero and sometimes some alcohol usually averaging out about twice a week.  But her situation with her respiratory tract changed somewhat.  She apparently was infected with COVID in April 2022, after receiving 2 COVID vaccines in January and February 2021, and she had multiorgan involvement including her head and chest and fever and constitutional symptoms.  She was treated with Paxlovid and 2 courses of systemic steroids and she has been slowly improving since that event.  But, her breathing is just not right.  She has a lot more wheezing and although she can still play tennis pretty well and does not use a short acting bronchodilator she still feels as though her respiratory tract is not back to baseline.  She states that she was stung by multiple yellow jackets mostly on her trunk about 20 years ago and developed large local reactions at each sting site without any associated systemic or constitutional symptoms.  She was given an EpiPen since that event.  Past Medical History:  Diagnosis Date   Asthma    Endometriosis    resolved by TAH   Hyperlipidemia    Lichen sclerosus of female genitalia 2020   Rectal fissure 12/97    Past Surgical History:  Procedure Laterality Date   ABDOMINAL HYSTERECTOMY  2000   BSO   TONSILLECTOMY AND ADENOIDECTOMY  10/07/1959    Allergies as of 05/28/2021       Reactions   Other Other (See Comments), Anaphylaxis   Patient states that there is an allergy to  something she received in anesthesia that begins with  (mes). They are pretty sure it was the latex but considered the other one to be an allergy as well since it was a high severity.   Latex Other (See Comments)   Patient almost died.   Nimbex [atracurium Besylate]    Praluent [alirocumab]    myalgias   Pravastatin    myalgias   Rosuvastatin    myalgias        Medication List    Advair Diskus 100-50 MCG/ACT Aepb Generic drug: fluticasone-salmeterol Inhale 1 puff into the  lungs 2 (two) times daily.   albuterol 108 (90 Base) MCG/ACT inhaler Commonly known as: VENTOLIN HFA SMARTSIG:1 Puff(s) Via Inhaler Every 4 Hours PRN   clobetasol cream 0.05 % Commonly known as: TEMOVATE Apply thinly to affected area twice daily for 5 - 7 days if has a flare.   COQ10 PO Take 1 capsule by mouth daily.   diclofenac sodium 1 % Gel Commonly known as: Voltaren Apply 2 g topically as directed.   esomeprazole 40 MG capsule Commonly known as: NEXIUM Take 20 mg by mouth daily before breakfast. Reported on 12/04/2015   fexofenadine 180 MG tablet Commonly known as: ALLEGRA Take 180 mg by mouth daily.   FISH OIL PO Take 1 capsule by mouth daily.   mometasone 0.1 % ointment Commonly known as: ELOCON Apply topically twice weekly   mometasone 50 MCG/ACT nasal spray Commonly known as: NASONEX Place 2 sprays into the nose daily.   montelukast 10 MG tablet Commonly known as: SINGULAIR Take 10 mg by mouth at bedtime.   multivitamins ther. w/minerals Tabs tablet Take 1 tablet by mouth daily.   nystatin powder Commonly known as: MYCOSTATIN/NYSTOP Apply 1 application topically 3 (three) times daily. Apply to affected area for up to 7 days   VITAMIN B-12 PO Take 1 tablet by mouth as needed.   ZYRTEC PO Take by mouth as needed.        Review of systems negative except as noted in HPI / PMHx or noted below:  Review of Systems  Constitutional: Negative.   HENT: Negative.    Eyes: Negative.   Respiratory: Negative.    Cardiovascular: Negative.   Gastrointestinal: Negative.   Genitourinary: Negative.   Musculoskeletal: Negative.   Skin: Negative.   Neurological: Negative.   Endo/Heme/Allergies: Negative.   Psychiatric/Behavioral: Negative.     Family History  Problem Relation Age of Onset   Breast cancer Mother 66    Social History   Socioeconomic History   Marital status: Married    Spouse name: Not on file   Number of children: Not on file    Years of education: Not on file   Highest education level: Not on file  Occupational History   Not on file  Tobacco Use   Smoking status: Never   Smokeless tobacco: Never  Vaping Use   Vaping Use: Never used  Substance and Sexual Activity   Alcohol use: Yes    Alcohol/week: 1.0 standard drink    Types: 1 Standard drinks or equivalent per week   Drug use: Never   Sexual activity: Not Currently    Partners: Male    Birth control/protection: Surgical    Comment: hysterectomy  Other Topics Concern   Not on file  Social History Narrative   Not on file   Environmental and Social history  Lives in a house with a dry environment, a dog located inside the household, carpet in  the bedroom, plastic on the bed, no plastic on the pillow, no smoking ongoing with inside the household.  She owns a pet sitting business.  Objective:   Vitals:   05/28/21 0918  BP: 122/80  Pulse: 67  Resp: 14  Temp: 97.6 F (36.4 C)  SpO2: 97%   Height: '5\' 1"'$  (154.9 cm) Weight: 162 lb 6 oz (73.7 kg)  Physical Exam Constitutional:      Appearance: She is not diaphoretic.     Comments: Raspy voice, throat clearing  HENT:     Head: Normocephalic.     Right Ear: Tympanic membrane, ear canal and external ear normal.     Left Ear: Tympanic membrane, ear canal and external ear normal.     Nose: Nose normal. No mucosal edema or rhinorrhea.     Mouth/Throat:     Pharynx: Uvula midline. No oropharyngeal exudate.  Eyes:     Conjunctiva/sclera: Conjunctivae normal.  Neck:     Thyroid: No thyromegaly.     Trachea: Trachea normal. No tracheal tenderness or tracheal deviation.  Cardiovascular:     Rate and Rhythm: Normal rate and regular rhythm.     Heart sounds: Normal heart sounds, S1 normal and S2 normal. No murmur heard. Pulmonary:     Effort: No respiratory distress.     Breath sounds: Normal breath sounds. No stridor. No wheezing or rales.  Lymphadenopathy:     Head:     Right side of head: No  tonsillar adenopathy.     Left side of head: No tonsillar adenopathy.     Cervical: No cervical adenopathy.  Skin:    Findings: No erythema or rash.     Nails: There is no clubbing.  Neurological:     Mental Status: She is alert.    Diagnostics: Allergy skin tests were not performed secondary to an insurance issue.   Spirometry was performed and demonstrated an FEV1 of 1.18 @ 58 % of predicted. FEV1/FVC = 0.66.  Following the administration of nebulized albuterol her FEV1 increased to 1.87 which was a calculated increase of 58%.   Assessment and Plan:    1. Not well controlled moderate persistent asthma   2. Perennial allergic rhinitis   3. Seasonal allergic rhinitis due to pollen   4. LPRD (laryngopharyngeal reflux disease)     1.  Allergen avoidance measures as best as possible  2.  Treat and prevent inflammation:  A. Breztri - 2 inhalations 2 times per day with spacer (empty lungs) B. Nasacort (or similar) - 2 sprays each nostril 1 time per day C. Montelukast 10 mg - 1 tablet 1 time per day  3.  Treat and prevent reflux/LPR:  A. Minimize caffeine consumption B. Nexium 40 mg - 1 tablet 2 times per day  4.  If needed:  A. Albuterol HFA - 2 inhalations every 4-6 hours B. Antihistamine  5.  Return to clinic in 4 weeks or earlier if problem  6.  Plan for fall flu vaccine  Peggy appears to have significant inflammation of her airway for which we will have her start a triple inhaler and also use some nasal steroids and a leukotriene modifier on a consistent basis.  In addition her history is consistent with LPR and we will have her use Nexium twice a day. I suspect that she will do very well and we can consolidate her therapy within a month. She will work through [employer] issue regarding skin testing. I will see her back  in this clinic in 4 weeks or earlier if problem.  Jiles Prows, MD Allergy / Immunology [facility]

## 2021-05-28 NOTE — Patient Instructions (Addendum)
  1.  Allergen avoidance measures as best as possible  2.  Treat and prevent inflammation:  A. Breztri - 2 inhalations 2 times per day with spacer (empty lungs) B. Nasacort (or similar) - 2 sprays each nostril 1 time per day C. Montelukast 10 mg - 1 tablet 1 time per day  3.  Treat and prevent reflux/LPR:  A. Minimize caffeine consumption B. Nexium 40 mg - 1 tablet 2 times per day  4.  If needed:  A. Albuterol HFA - 2 inhalations every 4-6 hours B. Antihistamine  5.  Return to clinic in 4 weeks or earlier if problem  6.  Plan for fall flu vaccine

## 2021-05-29 ENCOUNTER — Encounter: Payer: Self-pay | Admitting: Allergy and Immunology

## 2021-05-29 MED ORDER — BREZTRI AEROSPHERE 160-9-4.8 MCG/ACT IN AERO: 2.0000 | INHALATION_SPRAY | Freq: Two times a day (BID) | RESPIRATORY_TRACT | 5 refills | Status: DC

## 2021-05-29 MED ORDER — LEVOCETIRIZINE DIHYDROCHLORIDE 5 MG PO TABS: 5.0000 mg | ORAL_TABLET | Freq: Every evening | ORAL | 5 refills | Status: DC

## 2021-05-29 MED ORDER — TRIAMCINOLONE ACETONIDE 55 MCG/ACT NA AERO: 2.0000 | INHALATION_SPRAY | Freq: Every day | NASAL | 5 refills | Status: DC

## 2021-05-30 DIAGNOSIS — S46911D Strain of unspecified muscle, fascia and tendon at shoulder and upper arm level, right arm, subsequent encounter: Secondary | ICD-10-CM | POA: Diagnosis not present

## 2021-05-30 DIAGNOSIS — S46912D Strain of unspecified muscle, fascia and tendon at shoulder and upper arm level, left arm, subsequent encounter: Secondary | ICD-10-CM | POA: Diagnosis not present

## 2021-05-30 DIAGNOSIS — M62838 Other muscle spasm: Secondary | ICD-10-CM | POA: Diagnosis not present

## 2021-05-30 DIAGNOSIS — M6289 Other specified disorders of muscle: Secondary | ICD-10-CM | POA: Diagnosis not present

## 2021-06-07 ENCOUNTER — Telehealth: Payer: Self-pay | Admitting: Allergy and Immunology

## 2021-06-07 NOTE — Telephone Encounter (Signed)
Patient was given a sample of Breztri at her last appointment and she called today requesting 2 more samples. She said she would be out before her next appointment. Placed 2 samples up front for the patient.

## 2021-06-07 NOTE — Telephone Encounter (Signed)
Patient came in and picked up samples

## 2021-06-18 ENCOUNTER — Ambulatory Visit: Payer: Medicare Other | Admitting: Allergy and Immunology

## 2021-06-19 DIAGNOSIS — R7303 Prediabetes: Secondary | ICD-10-CM | POA: Diagnosis not present

## 2021-06-19 DIAGNOSIS — E785 Hyperlipidemia, unspecified: Secondary | ICD-10-CM | POA: Diagnosis not present

## 2021-06-19 DIAGNOSIS — E559 Vitamin D deficiency, unspecified: Secondary | ICD-10-CM | POA: Diagnosis not present

## 2021-06-24 ENCOUNTER — Ambulatory Visit: Payer: Medicare Other | Admitting: Allergy

## 2021-06-24 DIAGNOSIS — Z0000 Encounter for general adult medical examination without abnormal findings: Secondary | ICD-10-CM | POA: Diagnosis not present

## 2021-06-24 DIAGNOSIS — R7303 Prediabetes: Secondary | ICD-10-CM | POA: Diagnosis not present

## 2021-06-24 DIAGNOSIS — E559 Vitamin D deficiency, unspecified: Secondary | ICD-10-CM | POA: Diagnosis not present

## 2021-06-24 DIAGNOSIS — J45909 Unspecified asthma, uncomplicated: Secondary | ICD-10-CM | POA: Diagnosis not present

## 2021-06-24 DIAGNOSIS — K219 Gastro-esophageal reflux disease without esophagitis: Secondary | ICD-10-CM | POA: Diagnosis not present

## 2021-06-24 DIAGNOSIS — E785 Hyperlipidemia, unspecified: Secondary | ICD-10-CM | POA: Diagnosis not present

## 2021-06-26 NOTE — Patient Instructions (Addendum)
Moderate persistent asthma Stop Breztri due to hives Stop Advair 100/50 mcg Start Trelegy 200 mcg 1 puff once a day to  help prevent cough and wheeze. 2 samples given Continue Singulair 10 mg once a day to help prevent cough and wheeze May use albuterol 2 puffs every 4-6 hours as needed for cough, wheeze, tightness in chest, or shortness of breath. Asthma control goals:  Full participation in all desired activities (may need albuterol before activity) Albuterol use two time or less a week on average (not counting use with activity) Cough interfering with sleep two time or less a month Oral steroids no more than once a year No hospitalizations  Seasonal and perennial allergic rhinitis Start azelastine nasal spray 1-2 sprays each nostril twice a day as needed for runny nose/drainage down throat Continue Nasacort 2 sprays each nostril once a day as needed for stuffy nose May use an over the counter antihistamine such as Claritin (loratadine), Zyrtec (cetirizine), or Allegra (fexofenadine) once a day as needed for runny nose.  Laryngopharyngeal reflux disease Continue to decrease caffeine consumption Continue Nexium 20 mg twice a day Consider referral to GI if you continue to have problems  If you have rapid heart rate again please contact your primary care physician or go to the emergency room.  Please let us know if this treatment plan is not working well for you. Schedule a follow up appointment in 4 weeks

## 2021-06-28 ENCOUNTER — Encounter: Payer: Self-pay | Admitting: Family

## 2021-06-28 ENCOUNTER — Other Ambulatory Visit: Payer: Self-pay

## 2021-06-28 ENCOUNTER — Ambulatory Visit: Payer: Medicare Other | Admitting: Family

## 2021-06-28 VITALS — BP 120/78 | HR 67 | Temp 98.0°F | Resp 16 | Ht 61.0 in | Wt 162.5 lb

## 2021-06-28 DIAGNOSIS — J4540 Moderate persistent asthma, uncomplicated: Secondary | ICD-10-CM

## 2021-06-28 DIAGNOSIS — J301 Allergic rhinitis due to pollen: Secondary | ICD-10-CM

## 2021-06-28 DIAGNOSIS — J3089 Other allergic rhinitis: Secondary | ICD-10-CM

## 2021-06-28 DIAGNOSIS — K219 Gastro-esophageal reflux disease without esophagitis: Secondary | ICD-10-CM | POA: Diagnosis not present

## 2021-06-28 MED ORDER — MONTELUKAST SODIUM 10 MG PO TABS: 10.0000 mg | ORAL_TABLET | Freq: Every day | ORAL | 5 refills | Status: AC

## 2021-06-28 MED ORDER — TRIAMCINOLONE ACETONIDE 55 MCG/ACT NA AERO: INHALATION_SPRAY | NASAL | 3 refills | Status: AC

## 2021-06-28 MED ORDER — ALBUTEROL SULFATE HFA 108 (90 BASE) MCG/ACT IN AERS: 2.0000 | INHALATION_SPRAY | RESPIRATORY_TRACT | 1 refills | Status: AC | PRN

## 2021-06-28 MED ORDER — AZELASTINE HCL 0.1 % NA SOLN: NASAL | 5 refills | Status: AC

## 2021-06-28 NOTE — Progress Notes (Signed)
[place] [address] Dept: [phone]  FOLLOW UP NOTE  Patient ID: Christy Rojas, female    DOB: 07-29-1951  Age: 70 y.o. MRN: [id number] Date of Office Visit: 06/28/2021  Assessment  Chief Complaint: Asthma  HPI Christy Rojas is a 70 year old female who presents today for follow-up of not well controlled moderate persistent asthma, perennial allergic rhinitis, seasonal allergic rhinitis due to pollen, and laryngopharyngeal reflux disease.  She was last seen on May 28, 2021 by Dr. Neldon Mc.  Moderate persistent asthma is reported as not well controlled with Advair 100/50 2 puffs once a day, Singulair 10 mg once a day and albuterol as needed.  She reports shortness of breath that is not as bad as it was and wheezing that is relieved by albuterol.  She denies coughing, tightness in her chest, and nocturnal awakenings due to breathing problems.  She had to stop Breztri and restart Advair 100/50 due to [place] causing hives.  She reports that the hives lasted for about a week to week and a half.  Since her last office visit she has not required any systemic steroids or made any trips to the emergency room or urgent care due to breathing problems.  She has used her albuterol inhaler once since we last saw her.  Seasonal and perennial allergic rhinitis is reported as not well controlled with Zyrtec 10 mg once a day, Singulair 10 mg once a day, and Nasacort 2 sprays each nostril once a day.  She reports that she is not not able to take Xyzal due to it causing hives.  She tried taking both Xyzal and Breztri individually and this still caused hives.  She reports postnasal drip and off-and-on clear rhinorrhea.  She reports that at times her postnasal drip can be clear or sometimes brown.  She does mention that its been yellow in color for the past few days.  She denies ever, chills, nasal congestion sinus tenderness.  She has not had any sinus infections since we last saw  her.  Laryngopharyngeal reflux disease is reported as controlled with Nexium 20 mg twice a day.  She denies any heartburn or reflux symptoms.  She recently started twice a day dosing of Nexium 2 days ago as recommended by her primary care physician.  Prior to that she was taking Nexium 20 mg once a day.  She did not start taking the Nexium 40 mg twice a day because she thought that this could be too addictive.  She reports that in the past she had a history of Barrett's esophagus, but when she saw GI in 2018 and had endoscopy she was told that she no longer had Barrett's esophagus.  She continues to drink 1 cup of coffee in the morning.     Drug Allergies:  Allergies  Allergen Reactions   Other Other (See Comments) and Anaphylaxis    Patient states that there is an allergy to something she received in anesthesia that begins with  (mes). They are pretty sure it was the latex but considered the other one to be an allergy as well since it was a high severity.   Latex Other (See Comments)    Patient almost died.   Nimbex [Atracurium Besylate]    Praluent [Alirocumab]     myalgias   Pravastatin     myalgias   Rosuvastatin     myalgias    Review of Systems: Review of Systems  Constitutional:  Negative for chills and  fever.  HENT:         Reports postnasal drip and off-and-on clear rhinorrhea.  She denies nasal congestion  Eyes:        Reports occasional itchy eyes.  She wears contacts.  Respiratory:  Positive for shortness of breath and wheezing. Negative for cough.        Reports shortness of breath that is not as bad and wheezing that is relieved by albuterol use.  She denies coughing, tightness in her chest, nocturnal awakenings.  Cardiovascular:  Positive for palpitations. Negative for chest pain.       Reports that she woke up with a rapid heart rate 1 night and her PCP said that it could be due to reflux or anything  Gastrointestinal:        Denies heartburn and reflux symptoms.   Currently on Nexium 20 mg twice a day  Genitourinary:  Negative for frequency.  Skin:  Negative for itching and rash.       Reports hives with Judithann Sauger and Xyzal  Neurological:  Negative for headaches.  Endo/Heme/Allergies:  Positive for environmental allergies.    Physical Exam: BP 120/78   Pulse 67   Temp 98 F (36.7 C) (Temporal)   Resp 16   Ht 5\' 1"  (1.549 m)   Wt 162 lb 8 oz (73.7 kg)   LMP  (LMP Unknown)   SpO2 97%   BMI 30.70 kg/m    Physical Exam Constitutional:      Appearance: Normal appearance.  HENT:     Head: Normocephalic and atraumatic.     Comments: Pharynx normal, eyes normal, ears normal, nose: Bilateral lower turbinates mildly edematous and slightly erythematous with clear drainage noted    Right Ear: Tympanic membrane, ear canal and external ear normal.     Left Ear: Tympanic membrane, ear canal and external ear normal.     Mouth/Throat:     Mouth: Mucous membranes are moist.     Pharynx: Oropharynx is clear.  Eyes:     Conjunctiva/sclera: Conjunctivae normal.  Cardiovascular:     Rate and Rhythm: Normal rate and regular rhythm.     Heart sounds: Normal heart sounds.  Pulmonary:     Effort: Pulmonary effort is normal.     Breath sounds: Normal breath sounds.     Comments: Lungs clear to auscultation Musculoskeletal:     Cervical back: Neck supple.  Skin:    General: Skin is warm.     Comments: No rashes or urticarial lesions noted  Neurological:     Mental Status: She is alert and oriented to person, place, and time.  Psychiatric:        Mood and Affect: Mood normal.        Behavior: Behavior normal.        Thought Content: Thought content normal.        Judgment: Judgment normal.    Diagnostics: FVC 1.82 L, FEV1 1.32 L.  Predicted FVC 2.71 L, predicted FEV1 2.05 L.  Spirometry indicates mild restriction.  Assessment and Plan: 1. Not well controlled moderate persistent asthma   2. Perennial allergic rhinitis   3. Seasonal allergic  rhinitis due to pollen   4. LPRD (laryngopharyngeal reflux disease)     Meds ordered this encounter  Medications   montelukast (SINGULAIR) 10 MG tablet    Sig: Take 1 tablet (10 mg total) by mouth at bedtime.    Dispense:  30 tablet    Refill:  5  albuterol (VENTOLIN HFA) 108 (90 Base) MCG/ACT inhaler    Sig: Inhale 2 puffs into the lungs every 4 (four) hours as needed for wheezing or shortness of breath.    Dispense:  18 g    Refill:  1   azelastine (ASTELIN) 0.1 % nasal spray    Sig: spray 1-2 sprays each nostril twice a day as needed for runny nose/drainage down throat    Dispense:  30 mL    Refill:  5   triamcinolone (NASACORT) 55 MCG/ACT AERO nasal inhaler    Sig: Spray 2 sprays each nostril once a day as needed for stuffy nose    Dispense:  16.5 g    Refill:  3     Patient Instructions  Moderate persistent asthma Stop Breztri due to hives Stop Advair 100/50 mcg Start Trelegy 200 mcg 1 puff once a day to  help prevent cough and wheeze. 2 samples given Continue Singulair 10 mg once a day to help prevent cough and wheeze May use albuterol 2 puffs every 4-6 hours as needed for cough, wheeze, tightness in chest, or shortness of breath. Asthma control goals:  Full participation in all desired activities (may need albuterol before activity) Albuterol use two time or less a week on average (not counting use with activity) Cough interfering with sleep two time or less a month Oral steroids no more than once a year No hospitalizations  Seasonal and perennial allergic rhinitis Start azelastine nasal spray 1-2 sprays each nostril twice a day as needed for runny nose/drainage down throat Continue Nasacort 2 sprays each nostril once a day as needed for stuffy nose May use an over the counter antihistamine such as Claritin (loratadine), Zyrtec (cetirizine), or Allegra (fexofenadine) once a day as needed for runny nose.  Laryngopharyngeal reflux disease Continue to decrease  caffeine consumption Continue Nexium 20 mg twice a day Consider referral to GI if you continue to have problems  If you have rapid heart rate again please contact your primary care physician or go to the emergency room.  Please let us know if this treatment plan is not working well for you. Schedule a follow up appointment in 4 weeks Return in about 4 weeks (around 07/26/2021), or if symptoms worsen or fail to improve.    Thank you for the opportunity to care for this patient.  Please do not hesitate to contact me with questions.  Althea Charon, FNP Allergy and [facility]

## 2021-07-02 DIAGNOSIS — M6289 Other specified disorders of muscle: Secondary | ICD-10-CM | POA: Diagnosis not present

## 2021-07-02 DIAGNOSIS — S46912D Strain of unspecified muscle, fascia and tendon at shoulder and upper arm level, left arm, subsequent encounter: Secondary | ICD-10-CM | POA: Diagnosis not present

## 2021-07-02 DIAGNOSIS — S46911D Strain of unspecified muscle, fascia and tendon at shoulder and upper arm level, right arm, subsequent encounter: Secondary | ICD-10-CM | POA: Diagnosis not present

## 2021-07-02 DIAGNOSIS — M62838 Other muscle spasm: Secondary | ICD-10-CM | POA: Diagnosis not present

## 2021-07-08 DIAGNOSIS — Z23 Encounter for immunization: Secondary | ICD-10-CM | POA: Diagnosis not present

## 2021-07-15 DIAGNOSIS — S42255A Nondisplaced fracture of greater tuberosity of left humerus, initial encounter for closed fracture: Secondary | ICD-10-CM | POA: Diagnosis not present

## 2021-07-15 DIAGNOSIS — W548XXA Other contact with dog, initial encounter: Secondary | ICD-10-CM | POA: Diagnosis not present

## 2021-07-17 DIAGNOSIS — M25512 Pain in left shoulder: Secondary | ICD-10-CM | POA: Diagnosis not present

## 2021-07-17 DIAGNOSIS — S42252A Displaced fracture of greater tuberosity of left humerus, initial encounter for closed fracture: Secondary | ICD-10-CM | POA: Diagnosis not present

## 2021-07-31 DIAGNOSIS — Z9889 Other specified postprocedural states: Secondary | ICD-10-CM | POA: Diagnosis not present

## 2021-08-12 ENCOUNTER — Ambulatory Visit: Payer: Medicare Other | Admitting: Family

## 2021-08-14 DIAGNOSIS — M25512 Pain in left shoulder: Secondary | ICD-10-CM | POA: Diagnosis not present

## 2021-08-19 DIAGNOSIS — S42202D Unspecified fracture of upper end of left humerus, subsequent encounter for fracture with routine healing: Secondary | ICD-10-CM | POA: Diagnosis not present

## 2021-08-20 DIAGNOSIS — D225 Melanocytic nevi of trunk: Secondary | ICD-10-CM | POA: Diagnosis not present

## 2021-08-20 DIAGNOSIS — D235 Other benign neoplasm of skin of trunk: Secondary | ICD-10-CM | POA: Diagnosis not present

## 2021-08-20 DIAGNOSIS — L814 Other melanin hyperpigmentation: Secondary | ICD-10-CM | POA: Diagnosis not present

## 2021-08-21 DIAGNOSIS — S42202D Unspecified fracture of upper end of left humerus, subsequent encounter for fracture with routine healing: Secondary | ICD-10-CM | POA: Diagnosis not present

## 2021-08-26 DIAGNOSIS — Z961 Presence of intraocular lens: Secondary | ICD-10-CM | POA: Diagnosis not present

## 2021-08-26 DIAGNOSIS — H18603 Keratoconus, unspecified, bilateral: Secondary | ICD-10-CM | POA: Diagnosis not present

## 2021-08-27 DIAGNOSIS — S42202D Unspecified fracture of upper end of left humerus, subsequent encounter for fracture with routine healing: Secondary | ICD-10-CM | POA: Diagnosis not present

## 2021-09-02 DIAGNOSIS — R070 Pain in throat: Secondary | ICD-10-CM | POA: Diagnosis not present

## 2021-09-02 DIAGNOSIS — K219 Gastro-esophageal reflux disease without esophagitis: Secondary | ICD-10-CM | POA: Diagnosis not present

## 2021-09-04 DIAGNOSIS — S42202D Unspecified fracture of upper end of left humerus, subsequent encounter for fracture with routine healing: Secondary | ICD-10-CM | POA: Diagnosis not present

## 2021-09-06 DIAGNOSIS — S42202D Unspecified fracture of upper end of left humerus, subsequent encounter for fracture with routine healing: Secondary | ICD-10-CM | POA: Diagnosis not present

## 2021-09-09 DIAGNOSIS — S42202D Unspecified fracture of upper end of left humerus, subsequent encounter for fracture with routine healing: Secondary | ICD-10-CM | POA: Diagnosis not present

## 2021-09-10 ENCOUNTER — Telehealth: Payer: Self-pay | Admitting: Family

## 2021-09-10 MED ORDER — TRELEGY ELLIPTA 200-62.5-25 MCG/ACT IN AEPB: 1.0000 | INHALATION_SPRAY | Freq: Every day | RESPIRATORY_TRACT | 1 refills | Status: AC

## 2021-09-10 NOTE — Telephone Encounter (Signed)
Ok to send in prescription for Trelegy 200 mcg 1 puff once a day. Quantity #1 with 2 refills.

## 2021-09-10 NOTE — Telephone Encounter (Signed)
Patient is requesting trelegy (245mcg) be sent in for her.   [place] [address] [place] [address]  Best contact number: [phone]

## 2021-09-10 NOTE — Telephone Encounter (Signed)
Sent in trelegy 200 1 puff into lungs daily to [facility] [address]

## 2021-09-11 DIAGNOSIS — H0100A Unspecified blepharitis right eye, upper and lower eyelids: Secondary | ICD-10-CM | POA: Diagnosis not present

## 2021-09-12 DIAGNOSIS — S42202D Unspecified fracture of upper end of left humerus, subsequent encounter for fracture with routine healing: Secondary | ICD-10-CM | POA: Diagnosis not present

## 2021-09-16 DIAGNOSIS — S42202D Unspecified fracture of upper end of left humerus, subsequent encounter for fracture with routine healing: Secondary | ICD-10-CM | POA: Diagnosis not present

## 2021-09-18 DIAGNOSIS — S42202D Unspecified fracture of upper end of left humerus, subsequent encounter for fracture with routine healing: Secondary | ICD-10-CM | POA: Diagnosis not present

## 2021-09-25 DIAGNOSIS — S42202D Unspecified fracture of upper end of left humerus, subsequent encounter for fracture with routine healing: Secondary | ICD-10-CM | POA: Diagnosis not present

## 2021-10-01 DIAGNOSIS — S42202D Unspecified fracture of upper end of left humerus, subsequent encounter for fracture with routine healing: Secondary | ICD-10-CM | POA: Diagnosis not present

## 2021-10-03 DIAGNOSIS — S42202D Unspecified fracture of upper end of left humerus, subsequent encounter for fracture with routine healing: Secondary | ICD-10-CM | POA: Diagnosis not present

## 2021-10-04 DIAGNOSIS — M8589 Other specified disorders of bone density and structure, multiple sites: Secondary | ICD-10-CM | POA: Diagnosis not present

## 2021-10-04 DIAGNOSIS — Z1231 Encounter for screening mammogram for malignant neoplasm of breast: Secondary | ICD-10-CM | POA: Diagnosis not present

## 2021-10-08 DIAGNOSIS — S42202D Unspecified fracture of upper end of left humerus, subsequent encounter for fracture with routine healing: Secondary | ICD-10-CM | POA: Diagnosis not present

## 2021-10-09 ENCOUNTER — Encounter (HOSPITAL_BASED_OUTPATIENT_CLINIC_OR_DEPARTMENT_OTHER): Payer: Self-pay | Admitting: Obstetrics & Gynecology

## 2021-10-09 ENCOUNTER — Encounter (HOSPITAL_BASED_OUTPATIENT_CLINIC_OR_DEPARTMENT_OTHER): Payer: Self-pay | Admitting: *Deleted

## 2021-10-11 DIAGNOSIS — S42202D Unspecified fracture of upper end of left humerus, subsequent encounter for fracture with routine healing: Secondary | ICD-10-CM | POA: Diagnosis not present

## 2021-10-17 DIAGNOSIS — S42202D Unspecified fracture of upper end of left humerus, subsequent encounter for fracture with routine healing: Secondary | ICD-10-CM | POA: Diagnosis not present

## 2021-10-21 DIAGNOSIS — S42202D Unspecified fracture of upper end of left humerus, subsequent encounter for fracture with routine healing: Secondary | ICD-10-CM | POA: Diagnosis not present

## 2021-10-24 DIAGNOSIS — S42202D Unspecified fracture of upper end of left humerus, subsequent encounter for fracture with routine healing: Secondary | ICD-10-CM | POA: Diagnosis not present

## 2021-10-29 DIAGNOSIS — S42202D Unspecified fracture of upper end of left humerus, subsequent encounter for fracture with routine healing: Secondary | ICD-10-CM | POA: Diagnosis not present

## 2021-10-31 DIAGNOSIS — S42202D Unspecified fracture of upper end of left humerus, subsequent encounter for fracture with routine healing: Secondary | ICD-10-CM | POA: Diagnosis not present

## 2021-11-07 DIAGNOSIS — S42202D Unspecified fracture of upper end of left humerus, subsequent encounter for fracture with routine healing: Secondary | ICD-10-CM | POA: Diagnosis not present

## 2021-11-13 DIAGNOSIS — S42202D Unspecified fracture of upper end of left humerus, subsequent encounter for fracture with routine healing: Secondary | ICD-10-CM | POA: Diagnosis not present

## 2021-11-21 DIAGNOSIS — S42202D Unspecified fracture of upper end of left humerus, subsequent encounter for fracture with routine healing: Secondary | ICD-10-CM | POA: Diagnosis not present

## 2021-12-19 DIAGNOSIS — S42202D Unspecified fracture of upper end of left humerus, subsequent encounter for fracture with routine healing: Secondary | ICD-10-CM | POA: Diagnosis not present

## 2021-12-24 DIAGNOSIS — S42202D Unspecified fracture of upper end of left humerus, subsequent encounter for fracture with routine healing: Secondary | ICD-10-CM | POA: Diagnosis not present

## 2021-12-27 ENCOUNTER — Telehealth: Payer: Self-pay | Admitting: Interventional Cardiology

## 2021-12-27 NOTE — Telephone Encounter (Signed)
Patient is requesting to switch from Dr. Tamala Julian to Dr. Johney Frame.  ?

## 2022-01-02 DIAGNOSIS — S42202D Unspecified fracture of upper end of left humerus, subsequent encounter for fracture with routine healing: Secondary | ICD-10-CM | POA: Diagnosis not present

## 2022-01-07 DIAGNOSIS — S42202D Unspecified fracture of upper end of left humerus, subsequent encounter for fracture with routine healing: Secondary | ICD-10-CM | POA: Diagnosis not present

## 2022-01-14 DIAGNOSIS — S42202D Unspecified fracture of upper end of left humerus, subsequent encounter for fracture with routine healing: Secondary | ICD-10-CM | POA: Diagnosis not present

## 2022-01-20 DIAGNOSIS — M25572 Pain in left ankle and joints of left foot: Secondary | ICD-10-CM | POA: Diagnosis not present

## 2022-01-20 NOTE — Telephone Encounter (Signed)
okay

## 2022-01-30 DIAGNOSIS — M7662 Achilles tendinitis, left leg: Secondary | ICD-10-CM | POA: Diagnosis not present

## 2022-01-30 DIAGNOSIS — R531 Weakness: Secondary | ICD-10-CM | POA: Diagnosis not present

## 2022-02-17 DIAGNOSIS — J309 Allergic rhinitis, unspecified: Secondary | ICD-10-CM | POA: Diagnosis not present

## 2022-02-17 DIAGNOSIS — J45909 Unspecified asthma, uncomplicated: Secondary | ICD-10-CM | POA: Diagnosis not present

## 2022-02-17 DIAGNOSIS — G4700 Insomnia, unspecified: Secondary | ICD-10-CM | POA: Diagnosis not present

## 2022-02-17 DIAGNOSIS — D649 Anemia, unspecified: Secondary | ICD-10-CM | POA: Diagnosis not present

## 2022-02-17 DIAGNOSIS — R7303 Prediabetes: Secondary | ICD-10-CM | POA: Diagnosis not present

## 2022-02-26 DIAGNOSIS — B351 Tinea unguium: Secondary | ICD-10-CM | POA: Diagnosis not present

## 2022-02-26 DIAGNOSIS — L600 Ingrowing nail: Secondary | ICD-10-CM | POA: Diagnosis not present

## 2022-02-26 DIAGNOSIS — D649 Anemia, unspecified: Secondary | ICD-10-CM | POA: Diagnosis not present

## 2022-03-05 DIAGNOSIS — M25612 Stiffness of left shoulder, not elsewhere classified: Secondary | ICD-10-CM | POA: Diagnosis not present

## 2022-03-05 DIAGNOSIS — M25512 Pain in left shoulder: Secondary | ICD-10-CM | POA: Diagnosis not present

## 2022-03-10 ENCOUNTER — Ambulatory Visit: Payer: Medicare Other | Admitting: Podiatry

## 2022-03-10 DIAGNOSIS — B351 Tinea unguium: Secondary | ICD-10-CM

## 2022-03-10 NOTE — Patient Instructions (Signed)
You can also use "urea nail gel" on the toenails 

## 2022-03-17 DIAGNOSIS — B351 Tinea unguium: Secondary | ICD-10-CM | POA: Insufficient documentation

## 2022-03-17 DIAGNOSIS — M25612 Stiffness of left shoulder, not elsewhere classified: Secondary | ICD-10-CM | POA: Diagnosis not present

## 2022-03-17 DIAGNOSIS — M25512 Pain in left shoulder: Secondary | ICD-10-CM | POA: Diagnosis not present

## 2022-03-17 DIAGNOSIS — M79671 Pain in right foot: Secondary | ICD-10-CM | POA: Diagnosis not present

## 2022-03-17 NOTE — Progress Notes (Signed)
Subjective:   Patient ID: Christy Rojas, female   DOB: 71 y.o.   MRN: [id number]   HPI 71 year old female presents the office today for concerns of nail fungus.  She presents for medication Lamisil by her primary care doctor.  She was recently started on Lamisil.  No swelling redness or drainage to the toenail sites.  She has no other concerns.   Review of Systems  All other systems reviewed and are negative.  Past Medical History:  Diagnosis Date   Asthma    Endometriosis    resolved by TAH   Hyperlipidemia    Lichen sclerosus of female genitalia 2020   Rectal fissure 12/97    Past Surgical History:  Procedure Laterality Date   ABDOMINAL HYSTERECTOMY  2000   BSO   TONSILLECTOMY AND ADENOIDECTOMY  10/07/1959     Current Outpatient Medications:    ADVAIR DISKUS 100-50 MCG/DOSE AEPB, Inhale 1 puff into the lungs 2 (two) times daily., Disp: , Rfl:    albuterol (VENTOLIN HFA) 108 (90 Base) MCG/ACT inhaler, Inhale 2 puffs into the lungs every 4 (four) hours as needed for wheezing or shortness of breath., Disp: 18 g, Rfl: 1   azelastine (ASTELIN) 0.1 % nasal spray, spray 1-2 sprays each nostril twice a day as needed for runny nose/drainage down throat, Disp: 30 mL, Rfl: 5   Budeson-Glycopyrrol-Formoterol (BREZTRI AEROSPHERE) 160-9-4.8 MCG/ACT AERO, Inhale 2 puffs into the lungs in the morning and at bedtime., Disp: 10.7 g, Rfl: 5   Cetirizine HCl (ZYRTEC PO), Take by mouth as needed., Disp: , Rfl:    clobetasol cream (TEMOVATE) 0.05 %, Apply thinly to affected area twice daily for 5 - 7 days if has a flare., Disp: 60 g, Rfl: 0   Coenzyme Q10 (COQ10 PO), Take 1 capsule by mouth daily., Disp: , Rfl:    Cyanocobalamin (VITAMIN B-12 PO), Take 1 tablet by mouth as needed. , Disp: , Rfl:    diclofenac sodium (VOLTAREN) 1 % GEL, Apply 2 g topically as directed., Disp: 300 Tube, Rfl: 5   esomeprazole (NEXIUM) 40 MG capsule, Take 20 mg by mouth daily before breakfast. Reported on  12/04/2015, Disp: , Rfl:    fexofenadine (ALLEGRA) 180 MG tablet, Take 180 mg by mouth daily., Disp: , Rfl:    Fluticasone-Umeclidin-Vilant (TRELEGY ELLIPTA) 200-62.5-25 MCG/ACT AEPB, Inhale 1 puff into the lungs daily., Disp: 28 each, Rfl: 1   levocetirizine (XYZAL) 5 MG tablet, Take 1 tablet (5 mg total) by mouth every evening., Disp: 30 tablet, Rfl: 5   mometasone (ELOCON) 0.1 % ointment, Apply topically twice weekly, Disp: 45 g, Rfl: 3   montelukast (SINGULAIR) 10 MG tablet, Take 1 tablet (10 mg total) by mouth at bedtime., Disp: 30 tablet, Rfl: 5   Multiple Vitamins-Minerals (MULTIVITAMINS THER. W/MINERALS) TABS, Take 1 tablet by mouth daily., Disp: , Rfl:    nystatin (MYCOSTATIN/NYSTOP) powder, Apply 1 application topically 3 (three) times daily. Apply to affected area for up to 7 days (Patient not taking: Reported on 05/28/2021), Disp: 30 g, Rfl: 3   Omega-3 Fatty Acids (FISH OIL PO), Take 1 capsule by mouth daily.  , Disp: , Rfl:    triamcinolone (NASACORT) 55 MCG/ACT AERO nasal inhaler, Spray 2 sprays each nostril once a day as needed for stuffy nose, Disp: 16.5 g, Rfl: 3  Allergies  Allergen Reactions   Other Other (See Comments) and Anaphylaxis    Patient states that there is an allergy to something she received in anesthesia  that begins with  (mes). They are pretty sure it was the latex but considered the other one to be an allergy as well since it was a high severity.   Latex Other (See Comments)    Patient almost died.   Nimbex [Atracurium Besylate]    Praluent [Alirocumab]     myalgias   Pravastatin     myalgias   Rosuvastatin     myalgias           Objective:  Physical Exam  General: AAO x3, NAD  Dermatological: Bilateral hallux, second and fourth nails are hypertrophic, dystrophic with yellow, brown discoloration.  No significant pain in the nails today and there is no swelling or redness or any drainage or any signs of action.  No open lesions.  Vascular: Dorsalis  Pedis artery and Posterior Tibial artery pedal pulses are 2/4 bilateral with immedate capillary fill time.  There is no pain with calf compression, swelling, warmth, erythema.   Neruologic: Grossly intact via light touch bilateral.   Musculoskeletal: No gross boney pedal deformities bilateral. No pain, crepitus, or limitation noted with foot and ankle range of motion bilateral. Muscular strength 5/5 in all groups tested bilateral.  Gait: Unassisted, Nonantalgic.       Assessment:   Onychomycosis     Plan:  Patient has been started on Lamisil again she can medication without any issues.  Discussed topical medication in conjunction with the oral medication.  She feels the nails already starting to improve some.  However she the nails continue to be painful if there is any signs of infection will need to possibly proceed with nail removal but would hold off on that today.  Trula Slade DPM

## 2022-03-24 DIAGNOSIS — Z20822 Contact with and (suspected) exposure to covid-19: Secondary | ICD-10-CM | POA: Diagnosis not present

## 2022-03-24 DIAGNOSIS — R21 Rash and other nonspecific skin eruption: Secondary | ICD-10-CM | POA: Diagnosis not present

## 2022-03-24 DIAGNOSIS — R062 Wheezing: Secondary | ICD-10-CM | POA: Diagnosis not present

## 2022-03-26 DIAGNOSIS — B351 Tinea unguium: Secondary | ICD-10-CM | POA: Diagnosis not present

## 2022-03-26 DIAGNOSIS — D649 Anemia, unspecified: Secondary | ICD-10-CM | POA: Diagnosis not present

## 2022-04-10 NOTE — Progress Notes (Deleted)
Cardiology Office Note:    Date:  04/10/2022   ID:  Christy, Rojas [birth year], MRN [id number]  PCP:  London Pepper, MD   [facility] Cardiologist:  None   Referring MD: London Pepper, MD    History of Present Illness:    Christy Rojas is a 71 y.o. female with a hx of asthma and HLD who was followed by Dr. Tamala Julian for follow-up.   Was remotely seen by Dr. Marlou Porch with negative stress test in 2014. Has not tolerated statins in the past due to myalgias. Had Ca score in 12/2019 which was 7.4 (56% for age, sex matched controls).  Today, ***  Past Medical History:  Diagnosis Date   Asthma    Endometriosis    resolved by TAH   Hyperlipidemia    Lichen sclerosus of female genitalia 2020   Rectal fissure 12/97    Past Surgical History:  Procedure Laterality Date   ABDOMINAL HYSTERECTOMY  2000   BSO   TONSILLECTOMY AND ADENOIDECTOMY  10/07/1959    Current Medications: No outpatient medications have been marked as taking for the 04/14/22 encounter (Appointment) with Freada Bergeron, MD.     Allergies:   Other, Latex, Nimbex [atracurium besylate], Praluent [alirocumab], Pravastatin, and Rosuvastatin   Social History   Socioeconomic History   Marital status: Married    Spouse name: Not on file   Number of children: Not on file   Years of education: Not on file   Highest education level: Not on file  Occupational History   Not on file  Tobacco Use   Smoking status: Never   Smokeless tobacco: Never  Vaping Use   Vaping Use: Never used  Substance and Sexual Activity   Alcohol use: Yes    Alcohol/week: 1.0 standard drink of alcohol    Types: 1 Standard drinks or equivalent per week   Drug use: Never   Sexual activity: Not Currently    Partners: Male    Birth control/protection: Surgical    Comment: hysterectomy  Other Topics Concern   Not on file  Social History Narrative   Not on file   Social Determinants of Health    Financial Resource Strain: Not on file  Food Insecurity: Not on file  Transportation Needs: Not on file  Physical Activity: Not on file  Stress: Not on file  Social Connections: Not on file     Family History: The patient's ***family history includes Breast cancer (age of onset: 26) in her mother.  ROS:   Please see the history of present illness.    *** All other systems reviewed and are negative.  EKGs/Labs/Other Studies Reviewed:    The following studies were reviewed today: Ca score 12/2019: FINDINGS: Non-cardiac: See separate report from [facility].   Ascending Aorta: 3.7 cm, borderline dilated.   Pericardium: Normal   IMPRESSION: Coronary calcium score of 7.4. This was 56th percentile for age and sex matched control, suggesting intermediate risk for future cardiac events.   Christy Rojas  TTE 2012: Study Conclusions   - Left ventricle: The cavity size was normal. Wall thickness    was normal. Systolic function was normal. The estimated    ejection fraction was in the range of 50% to 55%. Wall    motion was normal; there were no regional wall motion    abnormalities. Doppler parameters are consistent with    abnormal left ventricular relaxation (grade 1 diastolic    dysfunction).  - Mitral  valve: Mild regurgitation.  - Left atrium: The atrium was mildly dilated.  - Atrial septum: There was redundancy of the septum, with    borderline criteria for aneurysm.  EKG:  EKG is *** ordered today.  The ekg ordered today demonstrates ***  Recent Labs: No results found for requested labs within last 365 days.  Recent Lipid Panel    Component Value Date/Time   CHOL 201 (H) 05/22/2020 0859   TRIG 48 05/22/2020 0859   HDL 66 05/22/2020 0859   CHOLHDL 3.0 05/22/2020 0859   LDLCALC 126 (H) 05/22/2020 0859     Risk Assessment/Calculations:   {Does this patient have ATRIAL FIBRILLATION?:[phone]}       Physical Exam:    VS:  LMP  (LMP Unknown)      Wt Readings from Last 3 Encounters:  06/28/21 162 lb 8 oz (73.7 kg)  05/28/21 162 lb 6 oz (73.7 kg)  04/04/21 166 lb 6.4 oz (75.5 kg)     GEN: *** Well nourished, well developed in no acute distress HEENT: Normal NECK: No JVD; No carotid bruits LYMPHATICS: No lymphadenopathy CARDIAC: ***RRR, no murmurs, rubs, gallops RESPIRATORY:  Clear to auscultation without rales, wheezing or rhonchi  ABDOMEN: Soft, non-tender, non-distended MUSCULOSKELETAL:  No edema; No deformity  SKIN: Warm and dry NEUROLOGIC:  Alert and oriented x 3 PSYCHIATRIC:  Normal affect   ASSESSMENT:    No diagnosis found. PLAN:    In order of problems listed above:  #HLD: #Elevated Calcium Score: #Statin Intolerance: Ca score 7.4 (56% for age, gender matched controls). Has not tolerated statin due to myalgias.  -Refer to lipid clinic      {Are you ordering a CV Procedure (e.g. stress test, cath, DCCV, TEE, etc)?   Press F2        :[id number]}    Medication Adjustments/Labs and Tests Ordered: Current medicines are reviewed at length with the patient today.  Concerns regarding medicines are outlined above.  No orders of the defined types were placed in this encounter.  No orders of the defined types were placed in this encounter.   There are no Patient Instructions on file for this visit.   Signed, Freada Bergeron, MD  04/10/2022 4:05 PM    [place]

## 2022-04-14 ENCOUNTER — Ambulatory Visit: Payer: Medicare Other | Admitting: Cardiology

## 2022-04-14 ENCOUNTER — Encounter: Payer: Self-pay | Admitting: Cardiology

## 2022-04-14 VITALS — BP 120/82 | HR 70 | Ht 61.0 in | Wt 162.2 lb

## 2022-04-14 DIAGNOSIS — R931 Abnormal findings on diagnostic imaging of heart and coronary circulation: Secondary | ICD-10-CM | POA: Diagnosis not present

## 2022-04-14 DIAGNOSIS — I340 Nonrheumatic mitral (valve) insufficiency: Secondary | ICD-10-CM | POA: Diagnosis not present

## 2022-04-14 DIAGNOSIS — E782 Mixed hyperlipidemia: Secondary | ICD-10-CM

## 2022-04-14 NOTE — Patient Instructions (Signed)
Medication Instructions:  Your physician recommends that you continue on your current medications as directed. Please refer to the Current Medication list given to you today.  *If you need a refill on your cardiac medications before your next appointment, please call your pharmacy*  Lab Work: If you have labs (blood work) drawn today and your tests are completely normal, you will receive your results only by: [place] (if you have MyChart) OR A paper copy in the mail If you have any lab test that is abnormal or we need to change your treatment, we will call you to review the results.  Testing/Procedures: Your physician has requested that you have an echocardiogram. Echocardiography is a painless test that uses sound waves to create images of your heart. It provides your doctor with information about the size and shape of your heart and how well your heart's chambers and valves are working. This procedure takes approximately one hour. There are no restrictions for this procedure.  Follow-Up: At [facility], you and your health needs are our priority.  As part of our continuing mission to provide you with exceptional heart care, we have created designated Provider Care Teams.  These Care Teams include your primary Cardiologist (physician) and Advanced Practice Providers (APPs -  Physician Assistants and Nurse Practitioners) who all work together to provide you with the care you need, when you need it.  We recommend signing up for the patient portal called "MyChart".  Sign up information is provided on this After Visit Summary.  MyChart is used to connect with patients for Virtual Visits (Telemedicine).  Patients are able to view lab/test results, encounter notes, upcoming appointments, etc.  Non-urgent messages can be sent to your provider as well.   To learn more about what you can do with MyChart, go to [email or web address].    Your next appointment:   6 month(s)  The format for  your next appointment:   In Person  Provider:   Freada Bergeron, MD {    Important Information About Sugar

## 2022-04-14 NOTE — Progress Notes (Signed)
Cardiology Office Note:    Date:  04/14/2022   ID:  Emanuel, Campos [birth year], MRN [id number]  PCP:  London Pepper, [facility] Prov[facility] Cardiologist:  Freada Bergeron, MD   Referring MD: London Pepper, MD    History of Present Illness:    Christy Rojas is a 71 y.o. female with a hx of asthma and HLD who was followed by Dr. Tamala Julian for follow-up.   Was remotely seen by Dr. Marlou Porch with negative stress test in 2014. Has not tolerated statins in the past due to myalgias. Had Ca score in 12/2019 which was 7.4 (56% for age, sex matched controls).  Today, the patient overall feels okay. Continues to remain active and plays tennis without exertional symptoms. Has occasional episodes where she "needs to take a deep breath" but these are not related to exertion. Has not been sleeping well and feels more fatigued. Also gained some weight which she is working on losing. She is otherwise doing well from a CV standpoint with no chest pain, orthopnea, PND, dizziness or syncope. Blood pressure is well controlled not on medications. Did not tolerate statins or repatha. Does not want to trial other lipid lowering therapy at this time.   Past Medical History:  Diagnosis Date   Asthma    Endometriosis    resolved by TAH   Hyperlipidemia    Lichen sclerosus of female genitalia 2020   Rectal fissure 12/97    Past Surgical History:  Procedure Laterality Date   ABDOMINAL HYSTERECTOMY  2000   BSO   TONSILLECTOMY AND ADENOIDECTOMY  10/07/1959    Current Medications: Current Meds  Medication Sig   albuterol (VENTOLIN HFA) 108 (90 Base) MCG/ACT inhaler Inhale 2 puffs into the lungs every 4 (four) hours as needed for wheezing or shortness of breath.   azelastine (ASTELIN) 0.1 % nasal spray spray 1-2 sprays each nostril twice a day as needed for runny nose/drainage down throat   Cetirizine HCl (ZYRTEC PO) Take by mouth as needed.   clobetasol cream (TEMOVATE) 0.05 % Apply  thinly to affected area twice daily for 5 - 7 days if has a flare.   Coenzyme Q10 (COQ10 PO) Take 1 capsule by mouth daily.   Cyanocobalamin (VITAMIN B-12 PO) Take 1 tablet by mouth as needed.    diclofenac sodium (VOLTAREN) 1 % GEL Apply 2 g topically as directed.   esomeprazole (NEXIUM) 40 MG capsule Take 20 mg by mouth daily before breakfast. Reported on 12/04/2015   Fluticasone-Umeclidin-Vilant (TRELEGY ELLIPTA) 200-62.5-25 MCG/ACT AEPB Inhale 1 puff into the lungs daily.   mometasone (ELOCON) 0.1 % ointment Apply topically twice weekly   montelukast (SINGULAIR) 10 MG tablet Take 1 tablet (10 mg total) by mouth at bedtime.   Multiple Vitamins-Minerals (MULTIVITAMINS THER. W/MINERALS) TABS Take 1 tablet by mouth daily.   nystatin (MYCOSTATIN/NYSTOP) powder Apply 1 application topically 3 (three) times daily. Apply to affected area for up to 7 days   Omega-3 Fatty Acids (FISH OIL PO) Take 1 capsule by mouth daily.     triamcinolone (NASACORT) 55 MCG/ACT AERO nasal inhaler Spray 2 sprays each nostril once a day as needed for stuffy nose     Allergies:   Other, Latex, Nimbex [atracurium besylate], Praluent [alirocumab], Pravastatin, and Rosuvastatin   Social History   Socioeconomic History   Marital status: Married    Spouse name: Not on file   Number of children: Not on file   Years of education:  Not on file   Highest education level: Not on file  Occupational History   Not on file  Tobacco Use   Smoking status: Never   Smokeless tobacco: Never  Vaping Use   Vaping Use: Never used  Substance and Sexual Activity   Alcohol use: Yes    Alcohol/week: 1.0 standard drink of alcohol    Types: 1 Standard drinks or equivalent per week   Drug use: Never   Sexual activity: Not Currently    Partners: Male    Birth control/protection: Surgical    Comment: hysterectomy  Other Topics Concern   Not on file  Social History Narrative   Not on file   Social Determinants of Health    Financial Resource Strain: Not on file  Food Insecurity: Not on file  Transportation Needs: Not on file  Physical Activity: Not on file  Stress: Not on file  Social Connections: Not on file     Family History: The patient's family history includes Breast cancer (age of onset: 64) in her mother.  ROS:   Please see the history of present illness.    (+) Fatigue (+) Shortness of Breath (+) Stress All other systems reviewed and are negative.  EKGs/Labs/Other Studies Reviewed:    The following studies were reviewed today: Ca score 12/2019: FINDINGS: Non-cardiac: See separate report from [facility].   Ascending Aorta: 3.7 cm, borderline dilated.   Pericardium: Normal   IMPRESSION: Coronary calcium score of 7.4. This was 56th percentile for age and sex matched control, suggesting intermediate risk for future cardiac events.   [name]  TTE 2012: Study Conclusions   - Left ventricle: The cavity size was normal. Wall thickness    was normal. Systolic function was normal. The estimated    ejection fraction was in the range of 50% to 55%. Wall    motion was normal; there were no regional wall motion    abnormalities. Doppler parameters are consistent with    abnormal left ventricular relaxation (grade 1 diastolic    dysfunction).  - Mitral valve: Mild regurgitation.  - Left atrium: The atrium was mildly dilated.  - Atrial septum: There was redundancy of the septum, with    borderline criteria for aneurysm.  EKG:  EKG is personally reviewed.  04/14/2022 EKG: NSR, LVH HR 70  Recent Labs: No results found for requested labs within last 365 days.  Recent Lipid Panel    Component Value Date/Time   CHOL 201 (H) 05/22/2020 0859   TRIG 48 05/22/2020 0859   HDL 66 05/22/2020 0859   CHOLHDL 3.0 05/22/2020 0859   LDLCALC 126 (H) 05/22/2020 0859     Risk Assessment/Calculations:           Physical Exam:    VS:  BP 120/82   Pulse 70   Ht '5\' 1"'$   (1.549 m)   Wt 162 lb 3.2 oz (73.6 kg)   LMP  (LMP Unknown)   SpO2 96%   BMI 30.65 kg/m     Wt Readings from Last 3 Encounters:  04/14/22 162 lb 3.2 oz (73.6 kg)  06/28/21 162 lb 8 oz (73.7 kg)  05/28/21 162 lb 6 oz (73.7 kg)     GEN:  Well nourished, well developed in no acute distress HEENT: Normal NECK: No JVD; No carotid bruits CARDIAC: RRR, soft systolic murmur, no rubs or gallops RESPIRATORY: Clear to auscultation without rales, wheezing or rhonchi  ABDOMEN: Soft, non-tender, non-distended MUSCULOSKELETAL:  No edema; No deformity  SKIN: Warm and Rojas NEUROLOGIC:  Alert and oriented x 3 PSYCHIATRIC:  Normal affect   ASSESSMENT:    1. Mixed hyperlipidemia   2. Elevated coronary artery calcium score   3. Mild mitral regurgitation    PLAN:    In order of problems listed above:  #HLD: #Elevated Calcium Score: #Statin Intolerance: Ca score 7.4 (56% for age, gender matched controls). Has not tolerated statin due to myalgias and did not tolerate repatha. Declined starting zetia today. Will follow-up labs from PCP and can consider trial of zetia in the future vs inclisiran. -Declined starting zetia today -Did not tolerate statins or repatha -Will follow-up labs by PCP  #Mild MR: Noted on TTE in 2012. -Check TTE for serial monitoring          Follow up in 6 months  Medication Adjustments/Labs and Tests Ordered: Current medicines are reviewed at length with the patient today.  Concerns regarding medicines are outlined above.  Orders Placed This Encounter  Procedures   EKG 12-Lead   ECHOCARDIOGRAM COMPLETE   No orders of the defined types were placed in this encounter.   Patient Instructions  Medication Instructions:  Your physician recommends that you continue on your current medications as directed. Please refer to the Current Medication list given to you today.  *If you need a refill on your cardiac medications before your next appointment, please call  your pharmacy*  Lab Work: If you have labs (blood work) drawn today and your tests are completely normal, you will receive your results only by: [place] (if you have MyChart) OR A paper copy in the mail If you have any lab test that is abnormal or we need to change your treatment, we will call you to review the results.  Testing/Procedures: Your physician has requested that you have an echocardiogram. Echocardiography is a painless test that uses sound waves to create images of your heart. It provides your doctor with information about the size and shape of your heart and how well your heart's chambers and valves are working. This procedure takes approximately one hour. There are no restrictions for this procedure.  Follow-Up: At [facility], you and your health needs are our priority.  As part of our continuing mission to provide you with exceptional heart care, we have created designated Provider Care Teams.  These Care Teams include your primary Cardiologist (physician) and Advanced Practice Providers (APPs -  Physician Assistants and Nurse Practitioners) who all work together to provide you with the care you need, when you need it.  We recommend signing up for the patient portal called "MyChart".  Sign up information is provided on this After Visit Summary.  MyChart is used to connect with patients for Virtual Visits (Telemedicine).  Patients are able to view lab/test results, encounter notes, upcoming appointments, etc.  Non-urgent messages can be sent to your provider as well.   To learn more about what you can do with MyChart, go to [email or web address].    Your next appointment:   6 month(s)  The format for your next appointment:   In Person  Provider:   Freada Bergeron, MD {    Important Information About Sugar          I,Tinashe Williams,acting as a scribe for Freada Bergeron, MD.,have documented all relevant documentation on the behalf of Freada Bergeron, MD,as directed by  Freada Bergeron, MD while in the presence of Freada Bergeron, MD.   I, Greer Ee  Johney Frame, MD, have reviewed all documentation for this visit. The documentation on 04/14/22 for the exam, diagnosis, procedures, and orders are all accurate and complete.

## 2022-04-15 DIAGNOSIS — M25612 Stiffness of left shoulder, not elsewhere classified: Secondary | ICD-10-CM | POA: Diagnosis not present

## 2022-04-24 ENCOUNTER — Ambulatory Visit (HOSPITAL_COMMUNITY): Payer: Medicare Other | Attending: Internal Medicine

## 2022-04-24 DIAGNOSIS — E782 Mixed hyperlipidemia: Secondary | ICD-10-CM | POA: Insufficient documentation

## 2022-04-24 DIAGNOSIS — I340 Nonrheumatic mitral (valve) insufficiency: Secondary | ICD-10-CM | POA: Insufficient documentation

## 2022-04-24 DIAGNOSIS — J45909 Unspecified asthma, uncomplicated: Secondary | ICD-10-CM | POA: Diagnosis not present

## 2022-04-24 DIAGNOSIS — E785 Hyperlipidemia, unspecified: Secondary | ICD-10-CM | POA: Diagnosis not present

## 2022-04-24 LAB — ECHOCARDIOGRAM COMPLETE
Area-P 1/2: 3.08 cm2
S' Lateral: 3.3 cm

## 2022-05-16 ENCOUNTER — Telehealth: Payer: Self-pay | Admitting: Cardiology

## 2022-05-16 DIAGNOSIS — I959 Hypotension, unspecified: Secondary | ICD-10-CM | POA: Diagnosis not present

## 2022-05-16 DIAGNOSIS — R55 Syncope and collapse: Secondary | ICD-10-CM | POA: Diagnosis not present

## 2022-05-16 DIAGNOSIS — R42 Dizziness and giddiness: Secondary | ICD-10-CM | POA: Diagnosis not present

## 2022-05-16 DIAGNOSIS — R110 Nausea: Secondary | ICD-10-CM | POA: Diagnosis not present

## 2022-05-16 DIAGNOSIS — R231 Pallor: Secondary | ICD-10-CM | POA: Diagnosis not present

## 2022-05-16 NOTE — Telephone Encounter (Signed)
Pt c/o Syncope: STAT if syncope occurred within 30 minutes and pt complains of lightheadedness High Priority if episode of passing out, completely, today or in last 24 hours   Did you pass out today? Yes  When is the last time you passed out?  Had never passed out before  Has this occurred multiple times?  Just the one time   Did you have any symptoms prior to passing out?   Sweaty    Patient stated she gave blood today and passed out afterward.  She was seen by EMS who gave her IV fluids and released her.  Patient stated she has a slight headache now.

## 2022-05-16 NOTE — Telephone Encounter (Signed)
Called patient about message. Patient stated she passed out.  Patient went to give blood this morning. After giving blood, the place released her without giving her fluids and/or snacks. 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 Patient stated she had some water and a banana when she was driving and felt fine. Then she went to the nail saloon, which was about 45 minutes later.  She stated she felt funny and had some sweating and then passed out. She came to about 10 minutes later with EMS treating her. EMS started an IV and gave her fluids. Patient does not know how low her BP got. Patient stated her daughter came and took her home, and she is at home resting now. Informed patient it is most likely she passed out due to low BP from giving blood. Patient stated EMS told her this happens a lot after people give blood. Informed patient that her message would be sent to Dr. Johney Frame to see if she has any advisement or testing she would like to do. Patient thanked me for calling and verbalized understanding.

## 2022-05-18 NOTE — Telephone Encounter (Signed)
I am so sorry this happened. It is very common after giving blood and I agree with EMS evaluation and giving her IVF. If she is feeling better, no further work-up needed now. If not, we can always get her scheduled with an APP for further evaluation.

## 2022-05-19 NOTE — Telephone Encounter (Signed)
Spoke with the pt.  She states she is feeling so much better and BP is normal at 130/80. Pt states no appt needed at this time and she was very appreciative for the follow-up call.  Pt verbalized understanding and agrees with this plan.

## 2022-06-09 DIAGNOSIS — M79672 Pain in left foot: Secondary | ICD-10-CM | POA: Diagnosis not present

## 2022-06-12 DIAGNOSIS — M79672 Pain in left foot: Secondary | ICD-10-CM | POA: Diagnosis not present

## 2022-06-16 DIAGNOSIS — M79672 Pain in left foot: Secondary | ICD-10-CM | POA: Diagnosis not present

## 2022-06-23 DIAGNOSIS — M79672 Pain in left foot: Secondary | ICD-10-CM | POA: Diagnosis not present

## 2022-06-25 DIAGNOSIS — M79672 Pain in left foot: Secondary | ICD-10-CM | POA: Diagnosis not present

## 2022-06-30 DIAGNOSIS — E785 Hyperlipidemia, unspecified: Secondary | ICD-10-CM | POA: Diagnosis not present

## 2022-06-30 DIAGNOSIS — R7303 Prediabetes: Secondary | ICD-10-CM | POA: Diagnosis not present

## 2022-06-30 DIAGNOSIS — E559 Vitamin D deficiency, unspecified: Secondary | ICD-10-CM | POA: Diagnosis not present

## 2022-07-02 DIAGNOSIS — K219 Gastro-esophageal reflux disease without esophagitis: Secondary | ICD-10-CM | POA: Diagnosis not present

## 2022-07-02 DIAGNOSIS — G4700 Insomnia, unspecified: Secondary | ICD-10-CM | POA: Diagnosis not present

## 2022-07-02 DIAGNOSIS — E559 Vitamin D deficiency, unspecified: Secondary | ICD-10-CM | POA: Diagnosis not present

## 2022-07-02 DIAGNOSIS — J45909 Unspecified asthma, uncomplicated: Secondary | ICD-10-CM | POA: Diagnosis not present

## 2022-07-02 DIAGNOSIS — R7303 Prediabetes: Secondary | ICD-10-CM | POA: Diagnosis not present

## 2022-07-02 DIAGNOSIS — E785 Hyperlipidemia, unspecified: Secondary | ICD-10-CM | POA: Diagnosis not present

## 2022-07-02 DIAGNOSIS — Z0000 Encounter for general adult medical examination without abnormal findings: Secondary | ICD-10-CM | POA: Diagnosis not present

## 2022-07-02 DIAGNOSIS — Z23 Encounter for immunization: Secondary | ICD-10-CM | POA: Diagnosis not present

## 2022-07-10 DIAGNOSIS — D225 Melanocytic nevi of trunk: Secondary | ICD-10-CM | POA: Diagnosis not present

## 2022-07-10 DIAGNOSIS — B351 Tinea unguium: Secondary | ICD-10-CM | POA: Diagnosis not present

## 2022-07-10 DIAGNOSIS — D235 Other benign neoplasm of skin of trunk: Secondary | ICD-10-CM | POA: Diagnosis not present

## 2022-07-10 DIAGNOSIS — L738 Other specified follicular disorders: Secondary | ICD-10-CM | POA: Diagnosis not present

## 2022-07-10 DIAGNOSIS — M79672 Pain in left foot: Secondary | ICD-10-CM | POA: Diagnosis not present

## 2022-07-12 DIAGNOSIS — M79672 Pain in left foot: Secondary | ICD-10-CM | POA: Diagnosis not present

## 2022-07-14 ENCOUNTER — Other Ambulatory Visit (HOSPITAL_BASED_OUTPATIENT_CLINIC_OR_DEPARTMENT_OTHER): Payer: Self-pay | Admitting: Obstetrics & Gynecology

## 2022-07-14 DIAGNOSIS — L900 Lichen sclerosus et atrophicus: Secondary | ICD-10-CM

## 2022-07-15 NOTE — Telephone Encounter (Signed)
Please let pt know I did complete the refill for her mometasone.  She does need a vulvar exam yearly (doesn't have to be AEX) due to hx of lichen sclerosus.  Thanks.

## 2022-07-16 NOTE — Telephone Encounter (Signed)
DPR reviewed. LMOVM that mometasone had been refilled. Advised that she does need to be seen yearly by Dr. Sabra Heck for vulvar exam. Advised to call with any questions.

## 2022-07-17 DIAGNOSIS — M79672 Pain in left foot: Secondary | ICD-10-CM | POA: Diagnosis not present

## 2022-07-24 DIAGNOSIS — M79672 Pain in left foot: Secondary | ICD-10-CM | POA: Diagnosis not present

## 2022-07-28 DIAGNOSIS — M79672 Pain in left foot: Secondary | ICD-10-CM | POA: Diagnosis not present

## 2022-07-30 DIAGNOSIS — M79672 Pain in left foot: Secondary | ICD-10-CM | POA: Diagnosis not present

## 2022-07-31 DIAGNOSIS — M25572 Pain in left ankle and joints of left foot: Secondary | ICD-10-CM | POA: Diagnosis not present

## 2022-08-06 DIAGNOSIS — M79672 Pain in left foot: Secondary | ICD-10-CM | POA: Diagnosis not present

## 2022-08-08 DIAGNOSIS — U071 COVID-19: Secondary | ICD-10-CM | POA: Diagnosis not present

## 2022-08-08 DIAGNOSIS — J45909 Unspecified asthma, uncomplicated: Secondary | ICD-10-CM | POA: Diagnosis not present

## 2022-08-18 DIAGNOSIS — M79672 Pain in left foot: Secondary | ICD-10-CM | POA: Diagnosis not present

## 2022-08-20 DIAGNOSIS — M79672 Pain in left foot: Secondary | ICD-10-CM | POA: Diagnosis not present

## 2022-08-26 DIAGNOSIS — M25579 Pain in unspecified ankle and joints of unspecified foot: Secondary | ICD-10-CM | POA: Diagnosis not present

## 2022-09-02 DIAGNOSIS — M25579 Pain in unspecified ankle and joints of unspecified foot: Secondary | ICD-10-CM | POA: Diagnosis not present

## 2022-09-08 DIAGNOSIS — H524 Presbyopia: Secondary | ICD-10-CM | POA: Diagnosis not present

## 2022-09-08 DIAGNOSIS — H52203 Unspecified astigmatism, bilateral: Secondary | ICD-10-CM | POA: Diagnosis not present

## 2022-09-08 DIAGNOSIS — Z961 Presence of intraocular lens: Secondary | ICD-10-CM | POA: Diagnosis not present

## 2022-09-17 DIAGNOSIS — K219 Gastro-esophageal reflux disease without esophagitis: Secondary | ICD-10-CM | POA: Diagnosis not present

## 2022-09-19 DIAGNOSIS — M25579 Pain in unspecified ankle and joints of unspecified foot: Secondary | ICD-10-CM | POA: Diagnosis not present

## 2022-10-07 DIAGNOSIS — M25579 Pain in unspecified ankle and joints of unspecified foot: Secondary | ICD-10-CM | POA: Diagnosis not present

## 2022-10-10 DIAGNOSIS — Z1231 Encounter for screening mammogram for malignant neoplasm of breast: Secondary | ICD-10-CM | POA: Diagnosis not present

## 2022-10-15 DIAGNOSIS — M25579 Pain in unspecified ankle and joints of unspecified foot: Secondary | ICD-10-CM | POA: Diagnosis not present

## 2022-10-21 ENCOUNTER — Encounter (HOSPITAL_BASED_OUTPATIENT_CLINIC_OR_DEPARTMENT_OTHER): Payer: Self-pay | Admitting: *Deleted

## 2022-10-22 DIAGNOSIS — M25579 Pain in unspecified ankle and joints of unspecified foot: Secondary | ICD-10-CM | POA: Diagnosis not present

## 2022-10-29 ENCOUNTER — Telehealth: Payer: Self-pay | Admitting: Cardiology

## 2022-10-29 NOTE — Telephone Encounter (Signed)
Patient is requesting an order for a cholesterol panel. She states her cholesterol was a little elevated during her yearly.

## 2022-10-29 NOTE — Telephone Encounter (Signed)
Cholesterol was 240 at PCP in the last view months. She cannot take most medications for this d/t unwanted SE.  She has seen our pharmD in the past about mngt for this Pt doing home remedies currently.... Metamucil and bergamont root. Advised to have PCP office fax over result.  Aware will forward this note to [facility], who will await result, discuss w/ MD, and then let her know recommendation. Pt agreeable to plan.

## 2022-10-30 NOTE — Telephone Encounter (Signed)
Printed labs off from [facility] and placed in medical records bag to have scanned into Dr. Johney Frame in-basket to further review and advise on.  Will route this message to Dr. Johney Frame to make her aware of incoming labs to be scanned into her basket, from the pts PCP.  Will follow-up with the pt on lab results once Dr. Johney Frame receives, reviews, and advises.

## 2022-11-03 DIAGNOSIS — M25579 Pain in unspecified ankle and joints of unspecified foot: Secondary | ICD-10-CM | POA: Diagnosis not present

## 2022-11-10 DIAGNOSIS — K3189 Other diseases of stomach and duodenum: Secondary | ICD-10-CM | POA: Diagnosis not present

## 2022-11-10 DIAGNOSIS — K319 Disease of stomach and duodenum, unspecified: Secondary | ICD-10-CM | POA: Diagnosis not present

## 2022-11-10 DIAGNOSIS — K219 Gastro-esophageal reflux disease without esophagitis: Secondary | ICD-10-CM | POA: Diagnosis not present

## 2022-11-12 DIAGNOSIS — K319 Disease of stomach and duodenum, unspecified: Secondary | ICD-10-CM | POA: Diagnosis not present

## 2022-11-17 DIAGNOSIS — M25579 Pain in unspecified ankle and joints of unspecified foot: Secondary | ICD-10-CM | POA: Diagnosis not present

## 2022-11-19 DIAGNOSIS — M25579 Pain in unspecified ankle and joints of unspecified foot: Secondary | ICD-10-CM | POA: Diagnosis not present

## 2022-12-03 DIAGNOSIS — M25579 Pain in unspecified ankle and joints of unspecified foot: Secondary | ICD-10-CM | POA: Diagnosis not present

## 2022-12-10 DIAGNOSIS — M25579 Pain in unspecified ankle and joints of unspecified foot: Secondary | ICD-10-CM | POA: Diagnosis not present

## 2022-12-19 DIAGNOSIS — M25579 Pain in unspecified ankle and joints of unspecified foot: Secondary | ICD-10-CM | POA: Diagnosis not present

## 2022-12-23 ENCOUNTER — Ambulatory Visit (INDEPENDENT_AMBULATORY_CARE_PROVIDER_SITE_OTHER): Payer: Medicare Other

## 2022-12-23 ENCOUNTER — Telehealth: Payer: Self-pay | Admitting: Podiatry

## 2022-12-23 ENCOUNTER — Ambulatory Visit: Payer: Medicare Other | Admitting: Podiatry

## 2022-12-23 DIAGNOSIS — Z79899 Other long term (current) drug therapy: Secondary | ICD-10-CM

## 2022-12-23 DIAGNOSIS — M779 Enthesopathy, unspecified: Secondary | ICD-10-CM | POA: Diagnosis not present

## 2022-12-23 DIAGNOSIS — B351 Tinea unguium: Secondary | ICD-10-CM

## 2022-12-23 DIAGNOSIS — M2032 Hallux varus (acquired), left foot: Secondary | ICD-10-CM | POA: Diagnosis not present

## 2022-12-23 DIAGNOSIS — M7662 Achilles tendinitis, left leg: Secondary | ICD-10-CM | POA: Diagnosis not present

## 2022-12-23 NOTE — Progress Notes (Signed)
Subjective: Chief Complaint  Patient presents with   Foot Problem    bone growth on foot, chronic achilies pain, foot fungus, left foot   72 year old female presents the office for the above concerns.  She thinks that she has a bone growth on her big toe.  She is not sure how it started.  She does been getting chronic Achilles pain and some injury by orthopedics for this but she was not able to come for the MRI.  She been doing physical therapy and she has nitro patches and other patches that she been using.  She is also concerned about nail fungus.  She did well on Lamisil but given side effects she is hesitant to do it again.  No recent injuries or changes otherwise.   Objective: AAO x3, NAD DP/PT pulses palpable bilaterally, CRT less than 3 seconds Nails are mildly hypertrophic and dystrophic with yellow, brown discoloration.  There is no edema, erythema or signs of infection. Hallux malleus on the left side there is mild erythema along the dorsal IPJ from irritation there is no skin breakdown or warmth.  The toe is flexible. Modest along the Achilles tendon on the left side.  Thompson test is negative.  No edema, erythema.  No pain with lateral compression of calcaneus.  No area pinpoint tenderness. 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  -Perivesicular synovitis continue physical therapy with discussed the modifications good arch support.  Continue home exercises as well.  Will try get a copy of the MRI from [facility] at her request -Hallux malleus is flexible.  Discussed different exercises of the toes and good arch support.  Toe crest.  If symptoms persist consider surgical intervention if needed. -Still has some residual nail fungus.  Discussed different medications.   Will do fluconazole.  Will check blood work prior. -Patient encouraged to call the office with any questions, concerns, change in symptoms.    Trula Slade DPM

## 2022-12-23 NOTE — Telephone Encounter (Signed)
Patient just wanted to remind you to call her medication in to [facility] on [address].

## 2022-12-24 ENCOUNTER — Other Ambulatory Visit: Payer: Self-pay | Admitting: Podiatry

## 2022-12-24 LAB — HEPATIC FUNCTION PANEL
ALT: 16 IU/L (ref 0–32)
AST: 20 IU/L (ref 0–40)
Albumin: 4.3 g/dL (ref 3.9–4.9)
Alkaline Phosphatase: 78 IU/L (ref 44–121)
Bilirubin Total: 0.3 mg/dL (ref 0.0–1.2)
Bilirubin, Direct: <0.1 mg/dL (ref 0.00–0.40)
Total Protein: 7.2 g/dL (ref 6.0–8.5)

## 2022-12-24 LAB — CBC WITH DIFFERENTIAL/PLATELET
Basophils Absolute: 0.1 10*3/uL (ref 0.0–0.2)
Basos: 2 %
EOS (ABSOLUTE): 0.3 10*3/uL (ref 0.0–0.4)
Eos: 4 %
Hematocrit: 41.1 % (ref 34.0–46.6)
Hemoglobin: 13.4 g/dL (ref 11.1–15.9)
Immature Grans (Abs): 0 10*3/uL (ref 0.0–0.1)
Immature Granulocytes: 0 %
Lymphocytes Absolute: 2.2 10*3/uL (ref 0.7–3.1)
Lymphs: 36 %
MCH: 28.7 pg (ref 26.6–33.0)
MCHC: 32.6 g/dL (ref 31.5–35.7)
MCV: 88 fL (ref 79–97)
Monocytes Absolute: 0.5 10*3/uL (ref 0.1–0.9)
Monocytes: 7 %
Neutrophils Absolute: 3 10*3/uL (ref 1.4–7.0)
Neutrophils: 51 %
Platelets: 327 10*3/uL (ref 150–450)
RBC: 4.67 x10E6/uL (ref 3.77–5.28)
RDW: 13.8 % (ref 11.7–15.4)
WBC: 6.1 10*3/uL (ref 3.4–10.8)

## 2022-12-24 MED ORDER — FLUCONAZOLE 150 MG PO TABS: 150.0000 mg | ORAL_TABLET | ORAL | 0 refills | Status: DC

## 2022-12-24 NOTE — Telephone Encounter (Signed)
Left a detailed message for the patient about lab results being reviewed first before medication is sent to pharmacy.

## 2023-01-04 NOTE — Progress Notes (Unsigned)
Cardiology Office Note:    Date:  01/05/2023   ID:  Sebella, Chawla 10-31-1950, MRN [id number]  PCP:  London Pepper, MD   [place] [facility] Cardiologist:  Freada Bergeron, MD   Referring MD: London Pepper, MD    History of Present Illness:    Christy Rojas is a 72 y.o. female with a hx of asthma and HLD who was followed by Dr. Tamala Julian for follow-up.   Was remotely seen by Dr. Marlou Porch with negative stress test in 2014. Has not tolerated statins in the past due to myalgias. Had Ca score in 12/2019 which was 7.4 (56% for age, sex matched controls).  Was last seen in clinic on 04/2022 where she was doing well from a CV standpoint. TTE 04/2022 for monitoring of MR showed EF 55-60%, G1DD, normal RV, mild MR, trivial AR.  Today, the patient overall feels well. Has been more fatigued recently but has not been sleeping as well. No chest pain, SOB, lightheadedness, or dizziness. Remains active and plays tennis, walks 1.61miles/day and is able to exercise without issues. Blood pressure well controlled. Cannot tolerate statins or repatha. Has not trialed zetia.   Past Medical History:  Diagnosis Date   Asthma    Endometriosis    resolved by TAH   Hyperlipidemia    Lichen sclerosus of female genitalia 2020   Rectal fissure 12/97    Past Surgical History:  Procedure Laterality Date   ABDOMINAL HYSTERECTOMY  2000   BSO   TONSILLECTOMY AND ADENOIDECTOMY  10/07/1959    Current Medications: Current Meds  Medication Sig   albuterol (VENTOLIN HFA) 108 (90 Base) MCG/ACT inhaler Inhale 2 puffs into the lungs every 4 (four) hours as needed for wheezing or shortness of breath.   azelastine (ASTELIN) 0.1 % nasal spray spray 1-2 sprays each nostril twice a day as needed for runny nose/drainage down throat   Cetirizine HCl (ZYRTEC PO) Take by mouth as needed.   clobetasol cream (TEMOVATE) 0.05 % Apply thinly to affected area twice daily for 5 - 7 days if has a flare.    Coenzyme Q10 (COQ10 PO) Take 1 capsule by mouth daily.   Cyanocobalamin (VITAMIN B-12 PO) Take 1 tablet by mouth as needed.    diclofenac sodium (VOLTAREN) 1 % GEL Apply 2 g topically as directed.   esomeprazole (NEXIUM) 40 MG capsule Take 20 mg by mouth daily before breakfast. Reported on 12/04/2015   fluconazole (DIFLUCAN) 150 MG tablet Take 1 tablet (150 mg total) by mouth once a week.   Fluticasone-Umeclidin-Vilant (TRELEGY ELLIPTA) 200-62.5-25 MCG/ACT AEPB Inhale 1 puff into the lungs daily.   mometasone (ELOCON) 0.1 % ointment APPLY  OINTMENT TOPICALLY TWICE A WEEK   montelukast (SINGULAIR) 10 MG tablet Take 1 tablet (10 mg total) by mouth at bedtime.   Multiple Vitamins-Minerals (MULTIVITAMINS THER. W/MINERALS) TABS Take 1 tablet by mouth daily.   nystatin (MYCOSTATIN/NYSTOP) powder Apply 1 application topically 3 (three) times daily. Apply to affected area for up to 7 days   Omega-3 Fatty Acids (FISH OIL PO) Take 1 capsule by mouth daily.     triamcinolone (NASACORT) 55 MCG/ACT AERO nasal inhaler Spray 2 sprays each nostril once a day as needed for stuffy nose     Allergies:   Other, Latex, Nimbex [atracurium besylate], Praluent [alirocumab], Pravastatin, and Rosuvastatin   Social History   Socioeconomic History   Marital status: Married    Spouse name: Not on file   Number of  children: Not on file   Years of education: Not on file   Highest education level: Not on file  Occupational History   Not on file  Tobacco Use   Smoking status: Never   Smokeless tobacco: Never  Vaping Use   Vaping Use: Never used  Substance and Sexual Activity   Alcohol use: Yes    Alcohol/week: 1.0 standard drink of alcohol    Types: 1 Standard drinks or equivalent per week   Drug use: Never   Sexual activity: Not Currently    Partners: Male    Birth control/protection: Surgical    Comment: hysterectomy  Other Topics Concern   Not on file  Social History Narrative   Not on file   Social  Determinants of Health   Financial Resource Strain: Not on file  Food Insecurity: Not on file  Transportation Needs: Not on file  Physical Activity: Not on file  Stress: Not on file  Social Connections: Not on file     Family History: The patient's family history includes Breast cancer (age of onset: 1) in her mother.  ROS:   Please see the history of present illness.    All other systems reviewed and are negative.  EKGs/Labs/Other Studies Reviewed:    The following studies were reviewed today:  TTE 04/2022: IMPRESSIONS     1. Left ventricular ejection fraction, by estimation, is 55 to 60%. Left  ventricular ejection fraction by PLAX is 57 %. The left ventricle has  normal function. The left ventricle has no regional wall motion  abnormalities. Left ventricular diastolic  parameters are consistent with Grade I diastolic dysfunction (impaired  relaxation).   2. Right ventricular systolic function is normal. The right ventricular  size is normal. There is normal pulmonary artery systolic pressure. The  estimated right ventricular systolic pressure is 123XX123 mmHg.   3. The mitral valve is abnormal. Mild mitral valve regurgitation.   4. The aortic valve is tricuspid. Aortic valve regurgitation is trivial.  Aortic valve sclerosis/calcification is present, without any evidence of  aortic stenosis.   5. The inferior vena cava is normal in size with greater than 50%  respiratory variability, suggesting right atrial pressure of 3 mmHg.  Ca score 12/2019: FINDINGS: Non-cardiac: See separate report from [facility].   Ascending Aorta: 3.7 cm, borderline dilated.   Pericardium: Normal   IMPRESSION: Coronary calcium score of 7.4. This was 56th percentile for age and sex matched control, suggesting intermediate risk for future cardiac events.   [name]  TTE 2012: Study Conclusions   - Left ventricle: The cavity size was normal. Wall thickness    was normal.  Systolic function was normal. The estimated    ejection fraction was in the range of 50% to 55%. Wall    motion was normal; there were no regional wall motion    abnormalities. Doppler parameters are consistent with    abnormal left ventricular relaxation (grade 1 diastolic    dysfunction).  - Mitral valve: Mild regurgitation.  - Left atrium: The atrium was mildly dilated.  - Atrial septum: There was redundancy of the septum, with    borderline criteria for aneurysm.  EKG:  EKG is personally reviewed.  04/14/2022 EKG: NSR, LVH HR 70  Recent Labs: 12/23/2022: ALT 16; Hemoglobin 13.4; Platelets 327  Recent Lipid Panel    Component Value Date/Time   CHOL 201 (H) 05/22/2020 0859   TRIG 48 05/22/2020 0859   HDL 66 05/22/2020 0859   CHOLHDL  3.0 05/22/2020 0859   LDLCALC 126 (H) 05/22/2020 0859     Risk Assessment/Calculations:           Physical Exam:    VS:  BP 122/74 (BP Location: Left Arm, Patient Position: Sitting, Cuff Size: Normal)   Pulse 93   Ht 5\' 1"  (1.549 m)   Wt 158 lb (71.7 kg)   LMP  (LMP Unknown)   BMI 29.85 kg/m     Wt Readings from Last 3 Encounters:  01/05/23 158 lb (71.7 kg)  04/14/22 162 lb 3.2 oz (73.6 kg)  06/28/21 162 lb 8 oz (73.7 kg)     GEN:  Well nourished, well developed in no acute distress HEENT: Normal NECK: No JVD; No carotid bruits CARDIAC: RRR, soft systolic murmur, no rubs or gallops RESPIRATORY: Clear to auscultation without rales, wheezing or rhonchi  ABDOMEN: Soft, non-tender, non-distended MUSCULOSKELETAL:  No edema; No deformity  SKIN: Warm and dry NEUROLOGIC:  Alert and oriented x 3 PSYCHIATRIC:  Normal affect   ASSESSMENT:    1. Elevated coronary artery calcium score   2. Mixed hyperlipidemia   3. Mild mitral regurgitation     PLAN:    In order of problems listed above:  #HLD: #Elevated Calcium Score: #Statin Intolerance: Ca score 7.4 (56% for age, gender matched controls). Has not tolerated statin due to  myalgias and did not tolerate repatha. Declined starting zetia today. Will follow-up labs from PCP and can consider trial of zetia in the future vs inclisiran. -Declined starting zetia today -Did not tolerate statins or repatha -Will follow-up labs by PCP  #Mild MR: Noted on TTE in 2012. -Check TTE for serial monitoring  #Fatigue: -Suspect this is related to poor sleep given reassuring CV work-up -If symptoms persist, can pursue stress testing at that time      Follow up in 6 months  Medication Adjustments/Labs and Tests Ordered: Current medicines are reviewed at length with the patient today.  Concerns regarding medicines are outlined above.  No orders of the defined types were placed in this encounter.  No orders of the defined types were placed in this encounter.   Patient Instructions  Medication Instructions:  Your physician recommends that you continue on your current medications as directed. Please refer to the Current Medication list given to you today.  *If you need a refill on your cardiac medications before your next appointment, please call your pharmacy*  Lab Work: None  Testing/Procedures: None  Follow-Up: At [facility], you and your health needs are our priority.  As part of our continuing mission to provide you with exceptional heart care, we have created designated Provider Care Teams.  These Care Teams include your primary Cardiologist (physician) and Advanced Practice Providers (APPs -  Physician Assistants and Nurse Practitioners) who all work together to provide you with the care you need, when you need it.  Your next appointment:   6 month(s)  Provider:   Freada Bergeron, MD

## 2023-01-05 ENCOUNTER — Encounter: Payer: Self-pay | Admitting: Cardiology

## 2023-01-05 ENCOUNTER — Encounter: Payer: Self-pay | Admitting: Podiatry

## 2023-01-05 ENCOUNTER — Ambulatory Visit: Payer: Medicare Other | Attending: Cardiology | Admitting: Cardiology

## 2023-01-05 VITALS — BP 122/74 | HR 93 | Ht 61.0 in | Wt 158.0 lb

## 2023-01-05 DIAGNOSIS — I340 Nonrheumatic mitral (valve) insufficiency: Secondary | ICD-10-CM | POA: Diagnosis not present

## 2023-01-05 DIAGNOSIS — R931 Abnormal findings on diagnostic imaging of heart and coronary circulation: Secondary | ICD-10-CM | POA: Diagnosis not present

## 2023-01-05 DIAGNOSIS — E782 Mixed hyperlipidemia: Secondary | ICD-10-CM | POA: Diagnosis not present

## 2023-01-05 NOTE — Patient Instructions (Signed)
Medication Instructions:  Your physician recommends that you continue on your current medications as directed. Please refer to the Current Medication list given to you today.  *If you need a refill on your cardiac medications before your next appointment, please call your pharmacy*  Lab Work: None  Testing/Procedures: None  Follow-Up: At [facility], you and your health needs are our priority.  As part of our continuing mission to provide you with exceptional heart care, we have created designated Provider Care Teams.  These Care Teams include your primary Cardiologist (physician) and Advanced Practice Providers (APPs -  Physician Assistants and Nurse Practitioners) who all work together to provide you with the care you need, when you need it.  Your next appointment:   6 month(s)  Provider:   Freada Bergeron, MD

## 2023-01-06 ENCOUNTER — Other Ambulatory Visit: Payer: Self-pay | Admitting: Podiatry

## 2023-01-06 ENCOUNTER — Encounter: Payer: Self-pay | Admitting: Cardiology

## 2023-01-06 DIAGNOSIS — Z79899 Other long term (current) drug therapy: Secondary | ICD-10-CM

## 2023-01-09 DIAGNOSIS — M25579 Pain in unspecified ankle and joints of unspecified foot: Secondary | ICD-10-CM | POA: Diagnosis not present

## 2023-01-11 DIAGNOSIS — M25521 Pain in right elbow: Secondary | ICD-10-CM | POA: Diagnosis not present

## 2023-01-11 DIAGNOSIS — S82024A Nondisplaced longitudinal fracture of right patella, initial encounter for closed fracture: Secondary | ICD-10-CM | POA: Diagnosis not present

## 2023-01-11 DIAGNOSIS — S50311A Abrasion of right elbow, initial encounter: Secondary | ICD-10-CM | POA: Diagnosis not present

## 2023-01-11 DIAGNOSIS — S80211A Abrasion, right knee, initial encounter: Secondary | ICD-10-CM | POA: Diagnosis not present

## 2023-01-11 DIAGNOSIS — M25561 Pain in right knee: Secondary | ICD-10-CM | POA: Diagnosis not present

## 2023-01-11 DIAGNOSIS — X58XXXA Exposure to other specified factors, initial encounter: Secondary | ICD-10-CM | POA: Diagnosis not present

## 2023-01-12 DIAGNOSIS — M25561 Pain in right knee: Secondary | ICD-10-CM | POA: Diagnosis not present

## 2023-01-21 DIAGNOSIS — M25561 Pain in right knee: Secondary | ICD-10-CM | POA: Diagnosis not present

## 2023-02-02 ENCOUNTER — Ambulatory Visit: Payer: Medicare Other | Admitting: Cardiology

## 2023-02-06 DIAGNOSIS — M25561 Pain in right knee: Secondary | ICD-10-CM | POA: Diagnosis not present

## 2023-02-16 DIAGNOSIS — Z79899 Other long term (current) drug therapy: Secondary | ICD-10-CM | POA: Diagnosis not present

## 2023-02-17 LAB — HEPATIC FUNCTION PANEL
ALT: 14 IU/L (ref 0–32)
AST: 18 IU/L (ref 0–40)
Albumin: 4.3 g/dL (ref 3.9–4.9)
Alkaline Phosphatase: 71 IU/L (ref 44–121)
Bilirubin Total: 0.4 mg/dL (ref 0.0–1.2)
Bilirubin, Direct: <0.1 mg/dL (ref 0.00–0.40)
Total Protein: 6.8 g/dL (ref 6.0–8.5)

## 2023-02-17 LAB — CBC WITH DIFFERENTIAL/PLATELET
Basophils Absolute: 0.1 10*3/uL (ref 0.0–0.2)
Basos: 1 %
EOS (ABSOLUTE): 0.2 10*3/uL (ref 0.0–0.4)
Eos: 4 %
Hematocrit: 39.5 % (ref 34.0–46.6)
Hemoglobin: 13.2 g/dL (ref 11.1–15.9)
Immature Grans (Abs): 0 10*3/uL (ref 0.0–0.1)
Immature Granulocytes: 0 %
Lymphocytes Absolute: 1.9 10*3/uL (ref 0.7–3.1)
Lymphs: 31 %
MCH: 28.8 pg (ref 26.6–33.0)
MCHC: 33.4 g/dL (ref 31.5–35.7)
MCV: 86 fL (ref 79–97)
Monocytes Absolute: 0.4 10*3/uL (ref 0.1–0.9)
Monocytes: 7 %
Neutrophils Absolute: 3.3 10*3/uL (ref 1.4–7.0)
Neutrophils: 57 %
Platelets: 310 10*3/uL (ref 150–450)
RBC: 4.59 x10E6/uL (ref 3.77–5.28)
RDW: 13.2 % (ref 11.7–15.4)
WBC: 5.9 10*3/uL (ref 3.4–10.8)

## 2023-03-09 DIAGNOSIS — M25561 Pain in right knee: Secondary | ICD-10-CM | POA: Diagnosis not present

## 2023-03-10 DIAGNOSIS — M25579 Pain in unspecified ankle and joints of unspecified foot: Secondary | ICD-10-CM | POA: Diagnosis not present

## 2023-03-16 ENCOUNTER — Encounter: Payer: Self-pay | Admitting: Cardiology

## 2023-03-16 DIAGNOSIS — E782 Mixed hyperlipidemia: Secondary | ICD-10-CM

## 2023-03-16 DIAGNOSIS — Z79899 Other long term (current) drug therapy: Secondary | ICD-10-CM

## 2023-03-16 DIAGNOSIS — R931 Abnormal findings on diagnostic imaging of heart and coronary circulation: Secondary | ICD-10-CM

## 2023-03-19 DIAGNOSIS — M25579 Pain in unspecified ankle and joints of unspecified foot: Secondary | ICD-10-CM | POA: Diagnosis not present

## 2023-03-27 DIAGNOSIS — M25579 Pain in unspecified ankle and joints of unspecified foot: Secondary | ICD-10-CM | POA: Diagnosis not present

## 2023-03-30 NOTE — Telephone Encounter (Signed)
Christy Cruel, LPN Patient is scheduled for 07/21/23 with Dr. Cristal Deer and she is aware

## 2023-03-31 ENCOUNTER — Ambulatory Visit: Payer: Medicare Other | Admitting: Podiatry

## 2023-03-31 DIAGNOSIS — Z79899 Other long term (current) drug therapy: Secondary | ICD-10-CM | POA: Diagnosis not present

## 2023-03-31 DIAGNOSIS — M7662 Achilles tendinitis, left leg: Secondary | ICD-10-CM

## 2023-03-31 DIAGNOSIS — B351 Tinea unguium: Secondary | ICD-10-CM

## 2023-03-31 MED ORDER — FLUCONAZOLE 150 MG PO TABS: 150.0000 mg | ORAL_TABLET | ORAL | 0 refills | Status: DC

## 2023-03-31 NOTE — Progress Notes (Signed)
Subjective: Chief Complaint  Patient presents with   Nail Problem    Fungus f/u     72 year old female presents the office for the above concerns.  She has done well with the oral medication but she is not sure if she wants to continue it.  She thinks her nails are looking better.  No swelling or redness or drainage.  She has been some discomfort the arch, Achilles.  No injuries that she reports.   Objective: AAO x3, NAD DP/PT pulses palpable bilaterally, CRT less than 3 seconds Nails are mildly hypertrophic and dystrophic with yellow, brown discoloration however there is some improvement of the nails.  There is no edema, erythema or signs of infection. Modest along the Achilles tendon on the left side.  Thompson test is negative.  No edema, erythema.  No pain with lateral compression of calcaneus.  No area pinpoint tenderness. 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  -We discussed continued oral medication but also decided topical.  Order compound cream through [employer].  She is I think a bit more medication.  If she like to do this I would like to recheck blood work.  Order CBC and LFT.  -We discussed stretching, icing on regular basis as well as shoes, good arch support.  Anti-inflammatories as needed.  If no improvement advanced imaging.  Vivi Barrack DPM

## 2023-03-31 NOTE — Patient Instructions (Signed)

## 2023-04-01 DIAGNOSIS — M25579 Pain in unspecified ankle and joints of unspecified foot: Secondary | ICD-10-CM | POA: Diagnosis not present

## 2023-04-06 DIAGNOSIS — M25579 Pain in unspecified ankle and joints of unspecified foot: Secondary | ICD-10-CM | POA: Diagnosis not present

## 2023-04-07 ENCOUNTER — Ambulatory Visit (INDEPENDENT_AMBULATORY_CARE_PROVIDER_SITE_OTHER): Payer: Medicare Other | Admitting: Obstetrics & Gynecology

## 2023-04-07 ENCOUNTER — Encounter (HOSPITAL_BASED_OUTPATIENT_CLINIC_OR_DEPARTMENT_OTHER): Payer: Self-pay | Admitting: Obstetrics & Gynecology

## 2023-04-07 VITALS — BP 126/77 | HR 63 | Ht 61.0 in | Wt 159.2 lb

## 2023-04-07 DIAGNOSIS — Z01419 Encounter for gynecological examination (general) (routine) without abnormal findings: Secondary | ICD-10-CM | POA: Diagnosis not present

## 2023-04-07 DIAGNOSIS — L900 Lichen sclerosus et atrophicus: Secondary | ICD-10-CM | POA: Diagnosis not present

## 2023-04-07 DIAGNOSIS — Z780 Asymptomatic menopausal state: Secondary | ICD-10-CM

## 2023-04-07 DIAGNOSIS — Z90710 Acquired absence of both cervix and uterus: Secondary | ICD-10-CM | POA: Diagnosis not present

## 2023-04-07 MED ORDER — MOMETASONE FUROATE 0.1 % EX OINT: TOPICAL_OINTMENT | CUTANEOUS | 3 refills | Status: DC

## 2023-04-07 NOTE — Progress Notes (Signed)
72 y.o. G1P1 Married White or Caucasian female here for breast and pelvic exam.  I am also following her for h/o lichen sclerosus.  Using mometasone twice weekly.  This is working well for her.  Does need RF.    H/o hysterectomy. Had vaginal cuff thickening in the past. Saw gyn/oncology and had evaluation.  Denies vaginal bleeding.    No LMP recorded (lmp unknown). Patient has had a hysterectomy.          Sexually active: Yes.    H/O STD:  no  Health Maintenance: PCP:  Dr. Kateri Plummer.  Last wellness appt was 07/2022.  Did blood work at that appt:  yes Vaccines are up to date:  pt is sure she's had pneumonia and HSV vaccination Colonoscopy:  04/24/2017, follow up 10 years MMG:  10/10/2022 Negative BMD:  10/04/2021, mild osteopenia Last pap smear:  h/o hysterectomy H/o abnormal pap smear:  no    reports that she has never smoked. She has never used smokeless tobacco. She reports current alcohol use of about 1.0 standard drink of alcohol per week. She reports that she does not use drugs.  Past Medical History:  Diagnosis Date   Asthma    Endometriosis    resolved by TAH   Hyperlipidemia    Lichen sclerosus of female genitalia 2020   Rectal fissure 12/97    Past Surgical History:  Procedure Laterality Date   ABDOMINAL HYSTERECTOMY  2000   BSO   TONSILLECTOMY AND ADENOIDECTOMY  10/07/1959    Current Outpatient Medications  Medication Sig Dispense Refill   albuterol (VENTOLIN HFA) 108 (90 Base) MCG/ACT inhaler Inhale 2 puffs into the lungs every 4 (four) hours as needed for wheezing or shortness of breath. 18 g 1   azelastine (ASTELIN) 0.1 % nasal spray spray 1-2 sprays each nostril twice a day as needed for runny nose/drainage down throat 30 mL 5   Cetirizine HCl (ZYRTEC PO) Take by mouth as needed.     clobetasol cream (TEMOVATE) 0.05 % Apply thinly to affected area twice daily for 5 - 7 days if has a flare. 60 g 0   Coenzyme Q10 (COQ10 PO) Take 1 capsule by mouth daily.      Cyanocobalamin (VITAMIN B-12 PO) Take 1 tablet by mouth as needed.      diclofenac sodium (VOLTAREN) 1 % GEL Apply 2 g topically as directed. 300 Tube 5   fluconazole (DIFLUCAN) 150 MG tablet Take 1 tablet (150 mg total) by mouth once a week. 12 tablet 0   Fluticasone-Umeclidin-Vilant (TRELEGY ELLIPTA) 200-62.5-25 MCG/ACT AEPB Inhale 1 puff into the lungs daily. 28 each 1   montelukast (SINGULAIR) 10 MG tablet Take 1 tablet (10 mg total) by mouth at bedtime. 30 tablet 5   Multiple Vitamins-Minerals (MULTIVITAMINS THER. W/MINERALS) TABS Take 1 tablet by mouth daily.     triamcinolone (NASACORT) 55 MCG/ACT AERO nasal inhaler Spray 2 sprays each nostril once a day as needed for stuffy nose 16.5 g 3   mometasone (ELOCON) 0.1 % ointment APPLY  OINTMENT TOPICALLY TWICE A WEEK 45 g 3   No current facility-administered medications for this visit.    Family History  Problem Relation Age of Onset   Breast cancer Mother 25    Review of Systems  Constitutional: Negative.   Genitourinary: Negative.     Exam:   BP 126/77 (BP Location: Left Arm, Patient Position: Sitting, Cuff Size: Large)   Pulse 63   Ht 5\' 1"  (1.549 m) Comment:  Reported  Wt 159 lb 3.2 oz (72.2 kg)   LMP  (LMP Unknown)   BMI 30.08 kg/m   Height: 5\' 1"  (154.9 cm) (Reported)  General appearance: alert, cooperative and appears stated age Breasts: normal appearance, no masses or tenderness Abdomen: soft, non-tender; bowel sounds normal; no masses,  no organomegaly Lymph nodes: Cervical, supraclavicular, and axillary nodes normal.  No abnormal inguinal nodes palpated Neurologic: Grossly normal  Pelvic: External genitalia:  no lesions              Urethra:  normal appearing urethra with no masses, tenderness or lesions              Bartholins and Skenes: normal                 Vagina: normal appearing vagina with atrophic changes and no discharge, no lesions              Cervix: absent              Pap taken: No. Bimanual  Exam:  Uterus:  uterus absent              Adnexa: no mass, fullness, tenderness               Rectovaginal: Confirms               Anus:  normal sphincter tone, no lesions  Chaperone, Ina Homes, CMA, was present for exam.  Assessment/Plan: 1. Encntr for gyn exam (general) (routine) w/o abn findings - Pap smear not indicated - Mammogram 10/10/2022 - Colonoscopy 2018, follow up 10 years - Bone mineral density 2022 with mild osteopenia - lab work done with PCP, Dr. Kateri Plummer - vaccines reviewed/updated  2. Lichen sclerosus et atrophicus - mometasone (ELOCON) 0.1 % ointment; APPLY  OINTMENT TOPICALLY TWICE A WEEK  Dispense: 45 g; Refill: 3  3. Postmenopausal - not on HRT  4. H/O: hysterectomy

## 2023-04-11 ENCOUNTER — Encounter: Payer: Self-pay | Admitting: Podiatry

## 2023-04-13 ENCOUNTER — Other Ambulatory Visit: Payer: Self-pay | Admitting: Cardiology

## 2023-04-13 DIAGNOSIS — E782 Mixed hyperlipidemia: Secondary | ICD-10-CM

## 2023-04-14 DIAGNOSIS — M25579 Pain in unspecified ankle and joints of unspecified foot: Secondary | ICD-10-CM | POA: Diagnosis not present

## 2023-04-14 NOTE — Addendum Note (Signed)
Addended by: Loa Socks on: 04/14/2023 07:25 AM   Modules accepted: Orders

## 2023-04-14 NOTE — Telephone Encounter (Signed)
-----   Message from Clerance Lav, DPM sent at 04/06/2023  9:23 AM EDT ----- Regarding: RE: I think that should be fine. Thank you ----- Message ----- From: Hester Mates, RMA Sent: 04/06/2023   9:19 AM EDT To: Clerance Lav, DPM  Good morning, in [place] today and Dr Ardelle Anton is out of the office for the week, in [place] tomorrow, ok to fax then? ----- Message ----- From: Clerance Lav, DPM Sent: 04/05/2023  11:12 PM EDT To: Malena Peer Myla Mauriello, RMA  Shemiah Rosch, I won't be in the [facility] office Monday morning.  I have a promo thing to do with [facility] in  today with Alvino Chapel and Linsi, which is why my patient schedule was cancelled for today by [facility], since this had to be scheduled in advance.    Can you fax this form for Dr. Ardelle Anton Monday morning?   Thank you.  -Dr. Burna Mortimer  ----- Message ----- From: Vivi Barrack, DPM Sent: 04/05/2023   7:54 AM EDT To: Malena Peer Doree Kuehne, RMA; Dia D McCaughan, DPM  Dia, I hate to ask you but can you fax a [facility] form for nail fungus for this patient? I am out of the office and realized that I brought the paper with me but I don't think I faxed it. It should be the first one on the list. Much appreciated! Sorry to ask.   Matt

## 2023-04-14 NOTE — Telephone Encounter (Signed)
-----   Message from Dia D McCaughan, DPM sent at 04/06/2023  9:23 AM EDT ----- Regarding: RE: I think that should be fine. Thank you ----- Message ----- From: Ricki Clack L, RMA Sent: 04/06/2023   9:19 AM EDT To: Dia D McCaughan, DPM  Good morning, in [place] today and Dr Wagoner is out of the office for the week, in [place] tomorrow, ok to fax then? ----- Message ----- From: McCaughan, Dia D, DPM Sent: 04/05/2023  11:12 PM EDT To: Itamar Mcgowan L Ricki Clack, RMA  Christy Rojas, I won't be in the [address] office Monday morning.  I have a promo thing to do with [facility] in [place] today with Ellen and Linsi, which is why my patient schedule was cancelled for today by [place], since this had to be scheduled in advance.    Can you fax this form for Dr. Wagoner Monday morning?   Thank you.  -Dr. McCaughan  ----- Message ----- From: Wagoner, Matthew R, DPM Sent: 04/05/2023   7:54 AM EDT To: Cadince Hilscher L Keanon Bevins, RMA; Dia D McCaughan, DPM  Dia, I hate to ask you but can you fax a [facility] form for nail fungus for this patient? I am out of the office and realized that I brought the paper with me but I don't think I faxed it. It should be the first one on the list. Much appreciated! Sorry to ask.   Matt     

## 2023-04-21 DIAGNOSIS — M25579 Pain in unspecified ankle and joints of unspecified foot: Secondary | ICD-10-CM | POA: Diagnosis not present

## 2023-04-28 DIAGNOSIS — M25579 Pain in unspecified ankle and joints of unspecified foot: Secondary | ICD-10-CM | POA: Diagnosis not present

## 2023-05-05 DIAGNOSIS — M25579 Pain in unspecified ankle and joints of unspecified foot: Secondary | ICD-10-CM | POA: Diagnosis not present

## 2023-05-12 ENCOUNTER — Ambulatory Visit: Payer: Medicare Other | Attending: Cardiology

## 2023-05-12 DIAGNOSIS — Z79899 Other long term (current) drug therapy: Secondary | ICD-10-CM

## 2023-05-12 DIAGNOSIS — E782 Mixed hyperlipidemia: Secondary | ICD-10-CM | POA: Diagnosis not present

## 2023-05-12 DIAGNOSIS — R931 Abnormal findings on diagnostic imaging of heart and coronary circulation: Secondary | ICD-10-CM

## 2023-05-12 LAB — LIPID PANEL
Chol/HDL Ratio: 3.8 ratio (ref 0.0–4.4)
Cholesterol, Total: 228 mg/dL — ABNORMAL HIGH (ref 100–199)
HDL: 60 mg/dL (ref >39)
LDL Chol Calc (NIH): 153 mg/dL — ABNORMAL HIGH (ref 0–99)
Triglycerides: 88 mg/dL (ref 0–149)
VLDL Cholesterol Cal: 15 mg/dL (ref 5–40)

## 2023-05-13 ENCOUNTER — Telehealth: Payer: Self-pay | Admitting: Cardiology

## 2023-05-13 MED ORDER — EZETIMIBE 10 MG PO TABS: 10.0000 mg | ORAL_TABLET | Freq: Every day | ORAL | 3 refills | Status: DC

## 2023-05-13 NOTE — Telephone Encounter (Signed)
Advised patient, she is willing to try Zetia  Rx sent to [facility]

## 2023-05-13 NOTE — Telephone Encounter (Signed)
Patient is returning call.  °

## 2023-05-13 NOTE — Addendum Note (Signed)
Addended by: Regis Bill B on: 05/13/2023 04:08 PM   Modules accepted: Orders

## 2023-05-13 NOTE — Telephone Encounter (Signed)
Patient is returning call in regards to results.

## 2023-05-13 NOTE — Telephone Encounter (Signed)
Christy Sprague, MD 05/13/2023 12:50 PM EDT     LDL has increased from 126 to 153. She has not tolerated statins or repatha. Would she like to trial zetia 10mg  daily or continue with lifestyle and follow-up with Dr. Cristal Deer as scheduled?

## 2023-05-13 NOTE — Telephone Encounter (Signed)
Attempted phone call to pt.  Left voicemail message to return call at [phone].

## 2023-05-14 DIAGNOSIS — M25579 Pain in unspecified ankle and joints of unspecified foot: Secondary | ICD-10-CM | POA: Diagnosis not present

## 2023-05-20 DIAGNOSIS — U071 COVID-19: Secondary | ICD-10-CM | POA: Diagnosis not present

## 2023-05-20 DIAGNOSIS — G4700 Insomnia, unspecified: Secondary | ICD-10-CM | POA: Diagnosis not present

## 2023-05-20 DIAGNOSIS — R062 Wheezing: Secondary | ICD-10-CM | POA: Diagnosis not present

## 2023-05-28 DIAGNOSIS — M25579 Pain in unspecified ankle and joints of unspecified foot: Secondary | ICD-10-CM | POA: Diagnosis not present

## 2023-05-28 DIAGNOSIS — Z79899 Other long term (current) drug therapy: Secondary | ICD-10-CM | POA: Diagnosis not present

## 2023-05-29 LAB — CBC WITH DIFFERENTIAL/PLATELET: Lymphs: 39 %

## 2023-06-01 ENCOUNTER — Encounter: Payer: Self-pay | Admitting: Podiatry

## 2023-06-23 DIAGNOSIS — M25579 Pain in unspecified ankle and joints of unspecified foot: Secondary | ICD-10-CM | POA: Diagnosis not present

## 2023-07-01 DIAGNOSIS — M79672 Pain in left foot: Secondary | ICD-10-CM | POA: Diagnosis not present

## 2023-07-02 DIAGNOSIS — M25579 Pain in unspecified ankle and joints of unspecified foot: Secondary | ICD-10-CM | POA: Diagnosis not present

## 2023-07-07 DIAGNOSIS — E785 Hyperlipidemia, unspecified: Secondary | ICD-10-CM | POA: Diagnosis not present

## 2023-07-07 DIAGNOSIS — E559 Vitamin D deficiency, unspecified: Secondary | ICD-10-CM | POA: Diagnosis not present

## 2023-07-07 DIAGNOSIS — R7303 Prediabetes: Secondary | ICD-10-CM | POA: Diagnosis not present

## 2023-07-09 DIAGNOSIS — E559 Vitamin D deficiency, unspecified: Secondary | ICD-10-CM | POA: Diagnosis not present

## 2023-07-09 DIAGNOSIS — J45909 Unspecified asthma, uncomplicated: Secondary | ICD-10-CM | POA: Diagnosis not present

## 2023-07-09 DIAGNOSIS — Z23 Encounter for immunization: Secondary | ICD-10-CM | POA: Diagnosis not present

## 2023-07-09 DIAGNOSIS — E785 Hyperlipidemia, unspecified: Secondary | ICD-10-CM | POA: Diagnosis not present

## 2023-07-09 DIAGNOSIS — K219 Gastro-esophageal reflux disease without esophagitis: Secondary | ICD-10-CM | POA: Diagnosis not present

## 2023-07-09 DIAGNOSIS — R7303 Prediabetes: Secondary | ICD-10-CM | POA: Diagnosis not present

## 2023-07-09 DIAGNOSIS — G4700 Insomnia, unspecified: Secondary | ICD-10-CM | POA: Diagnosis not present

## 2023-07-09 DIAGNOSIS — Z0000 Encounter for general adult medical examination without abnormal findings: Secondary | ICD-10-CM | POA: Diagnosis not present

## 2023-07-14 DIAGNOSIS — M25579 Pain in unspecified ankle and joints of unspecified foot: Secondary | ICD-10-CM | POA: Diagnosis not present

## 2023-07-16 ENCOUNTER — Ambulatory Visit: Payer: Medicare Other | Admitting: Internal Medicine

## 2023-07-20 DIAGNOSIS — M25579 Pain in unspecified ankle and joints of unspecified foot: Secondary | ICD-10-CM | POA: Diagnosis not present

## 2023-07-21 ENCOUNTER — Encounter (HOSPITAL_BASED_OUTPATIENT_CLINIC_OR_DEPARTMENT_OTHER): Payer: Self-pay | Admitting: Cardiology

## 2023-07-21 ENCOUNTER — Ambulatory Visit (HOSPITAL_BASED_OUTPATIENT_CLINIC_OR_DEPARTMENT_OTHER): Payer: Medicare Other | Admitting: Cardiology

## 2023-07-21 VITALS — BP 124/62 | HR 78 | Ht 61.0 in | Wt 166.8 lb

## 2023-07-21 DIAGNOSIS — I340 Nonrheumatic mitral (valve) insufficiency: Secondary | ICD-10-CM

## 2023-07-21 DIAGNOSIS — M791 Myalgia, unspecified site: Secondary | ICD-10-CM

## 2023-07-21 DIAGNOSIS — T466X5A Adverse effect of antihyperlipidemic and antiarteriosclerotic drugs, initial encounter: Secondary | ICD-10-CM | POA: Diagnosis not present

## 2023-07-21 DIAGNOSIS — E782 Mixed hyperlipidemia: Secondary | ICD-10-CM

## 2023-07-21 DIAGNOSIS — Z7189 Other specified counseling: Secondary | ICD-10-CM | POA: Diagnosis not present

## 2023-07-21 DIAGNOSIS — R931 Abnormal findings on diagnostic imaging of heart and coronary circulation: Secondary | ICD-10-CM | POA: Diagnosis not present

## 2023-07-21 NOTE — Patient Instructions (Signed)
Medication Instructions:  START EZETIMIBE 10 MG DAILY    *If you need a refill on your cardiac medications before your next appointment, please call your pharmacy*  Lab Work: FASTING LIPID 1 WEEK PRIOR TO FOLLOW UP   If you have labs (blood work) drawn today and your tests are completely normal, you will receive your results only by: MyChart Message (if you have MyChart) OR A paper copy in the mail If you have any lab test that is abnormal or we need to change your treatment, we will call you to review the results.  Testing/Procedures: NONE  Follow-Up: At [facility], you and your health needs are our priority.  As part of our continuing mission to provide you with exceptional heart care, we have created designated Provider Care Teams.  These Care Teams include your primary Cardiologist (physician) and Advanced Practice Providers (APPs -  Physician Assistants and Nurse Practitioners) who all work together to provide you with the care you need, when you need it.  We recommend signing up for the patient portal called "MyChart".  Sign up information is provided on this After Visit Summary.  MyChart is used to connect with patients for Virtual Visits (Telemedicine).  Patients are able to view lab/test results, encounter notes, upcoming appointments, etc.  Non-urgent messages can be sent to your provider as well.   To learn more about what you can do with MyChart, go to [email or web address].    Your next appointment:   6 month(s)  Provider:   Jodelle Red, MD

## 2023-07-21 NOTE — Progress Notes (Signed)
Cardiology Office Note:  .    Date:  07/21/2023  ID:  Christy Rojas, DOB 27-Feb-1951, MRN [id number] PCP: Farris Has, MD  [facility] Providers Cardiologist:  Jodelle Red, MD     History of Present Illness: Marland Kitchen    Christy Rojas is a 72 y.o. female with a hx of nonobstructive CAD, hyperlipidemia, asthma, here for follow-up and to establish care with me. She is formerly a patient of Dr. Shari Prows, last seen by her 01/05/2023. At that visit she complained of fatigue in the setting of not sleeping well. Otherwise felt well from a cardiovascular perspective and remained active with playing tennis, walking 1.5 miles per day. Blood pressure was well controlled. As of 05/2023, lipid panel showed increase of LDL from 126 to 153 and she was started on Zetia 10 mg daily.  Cardiac Hx: Remotely seen by Dr. Anne Fu with negative stress test in 2014. Has known statin intolerance in the past due to myalgias. Coronary CT 12/2019 revealed coronary calcium score 7.4 (56% for age, sex matched controls). TTE 04/2022 for monitoring of MR showed EF 55-60%, G1DD, normal RV, mild MR, trivial AR.   Cardiovascular risk factors: Prior clinical ASCVD: Coronary CT 12/2019 revealed coronary calcium score of 7.4. Comorbid conditions: Hyperlipidemia - She has not yet started the Zetia as she had a COVID infection in August and has been recovering. She does plan to start the 10 mg Zetia. Previously intolerant of pravastatin, rosuvastatin, and Repatha. Had also tried taking CoQ10 without improvement in muscle cramps. Metabolic syndrome/Obesity:  Current weight 166 lbs. Chronic inflammatory conditions: None. Tobacco use history:  Never. Prior pertinent testing and/or incidental findings: Coronary CT 12/2019, TTE 04/2022 as above. Exercise level:  Plays tennis. However, since fracturing her patella in April, she has been walking some but has not yet returned to her normal exercise routines. She may feel mildly  short of breath which she attributes to deconditioning. Lately she has needed to use her asthma inhaler more frequently and take OTC allergy medications. Additionally, she complains of feeling fatigued. Current diet: Eating oatmeal every day.  Recently in August she was infected with COVID-19, which has been her third infection. In the past she has had three vaccinations but had significant adverse reactions with each. She has been advised to not receive any further COVID vaccinations.  She denies any palpitations, chest pain, peripheral edema, lightheadedness, headaches, syncope, orthopnea, or PND.  ROS:  Please see the history of present illness. ROS otherwise negative except as noted.  (+) Fatigue (+) Exertional shortness of breath  Studies Reviewed: Marland Kitchen    EKG Interpretation Date/Time:  Tuesday July 21 2023 10:42:30 EDT Ventricular Rate:  67 PR Interval:  170 QRS Duration:  82 QT Interval:  380 QTC Calculation: 401 R Axis:   2  Text Interpretation: Normal sinus rhythm Normal ECG When compared with ECG of 02-Nov-2000 14:40, No significant change was found Confirmed by Jodelle Red [phone]) on 07/21/2023 10:59:29 AM    Echo  04/24/2022:  1. Left ventricular ejection fraction, by estimation, is 55 to 60%. Left  ventricular ejection fraction by PLAX is 57 %. The left ventricle has  normal function. The left ventricle has no regional wall motion  abnormalities. Left ventricular diastolic  parameters are consistent with Grade I diastolic dysfunction (impaired  relaxation).   2. Right ventricular systolic function is normal. The right ventricular  size is normal. There is normal pulmonary artery systolic pressure. The  estimated right ventricular systolic  pressure is 21.8 mmHg.   3. The mitral valve is abnormal. Mild mitral valve regurgitation.   4. The aortic valve is tricuspid. Aortic valve regurgitation is trivial.  Aortic valve sclerosis/calcification is present, without  any evidence of  aortic stenosis.   5. The inferior vena cava is normal in size with greater than 50%  respiratory variability, suggesting right atrial pressure of 3 mmHg.   CT Cardiac Scoring  12/29/2019: FINDINGS: Non-cardiac: See separate report from [facility].   Ascending Aorta: 3.7 cm, borderline dilated.   Pericardium: Normal  IMPRESSION: Coronary calcium score of 7.4. This was 56th percentile for age and sex matched control, suggesting intermediate risk for future cardiac events.  Physical Exam:    VS:  BP 124/62   Pulse 78   Ht 5\' 1"  (1.549 m)   Wt 166 lb 12.8 oz (75.7 kg)   LMP  (LMP Unknown)   BMI 31.52 kg/m    Wt Readings from Last 3 Encounters:  07/21/23 166 lb 12.8 oz (75.7 kg)  04/07/23 159 lb 3.2 oz (72.2 kg)  01/05/23 158 lb (71.7 kg)    GEN: Well nourished, well developed in no acute distress HEENT: Normal, moist mucous membranes NECK: No JVD CARDIAC: regular rhythm, normal S1 and S2, no rubs or gallops. No murmur. VASCULAR: Radial and DP pulses 2+ bilaterally. No carotid bruits RESPIRATORY:  Clear to auscultation without rales, wheezing or rhonchi  ABDOMEN: Soft, non-tender, non-distended MUSCULOSKELETAL:  Ambulates independently SKIN: Warm and dry, no edema NEUROLOGIC:  Alert and oriented x 3. No focal neuro deficits noted. PSYCHIATRIC:  Normal affect   ASSESSMENT AND PLAN: .    Elevated calcium score Hypercholesterolemia Statin intolerance -score 7.4 (56th percentile) -statin myalgia -did not tolerate repatha due to cramps -hasn't started ezetimibe. Prior LDL 153 05/2023. Recent recheck 130. She is willing to try zetia and will contact me with issues. Recheck lipids prior to follow up in 6 mos  Mild MR -follow clinically, recheck 2028 or sooner as needed  Fatigue, shortness of breath, allergies, asthma -had Covid for the 3rd time in August, still recovering  Elevated A1c: 6.4 at [facility] 10/1.   CV risk counseling and  prevention -recommend heart healthy/Mediterranean diet, with whole grains, fruits, vegetable, fish, lean meats, nuts, and olive oil. Limit salt. -recommend moderate walking, 3-5 times/week for 30-50 minutes each session. Aim for at least 150 minutes.week. Goal should be pace of 3 miles/hours, or walking 1.5 miles in 30 minutes -recommend avoidance of tobacco products. Avoid excess alcohol.  Dispo: Follow-up in 6 months, or sooner as needed.  I,Mathew Stumpf,acting as a Neurosurgeon for [employer], MD.,have documented all relevant documentation on the behalf of Jodelle Red, MD,as directed by  Jodelle Red, MD while in the presence of Jodelle Red, MD.  I, Jodelle Red, MD, have reviewed all documentation for this visit. The documentation on 07/21/23 for the exam, diagnosis, procedures, and orders are all accurate and complete.   Signed, Jodelle Red, MD

## 2023-07-22 DIAGNOSIS — D225 Melanocytic nevi of trunk: Secondary | ICD-10-CM | POA: Diagnosis not present

## 2023-07-22 DIAGNOSIS — L570 Actinic keratosis: Secondary | ICD-10-CM | POA: Diagnosis not present

## 2023-07-22 DIAGNOSIS — L814 Other melanin hyperpigmentation: Secondary | ICD-10-CM | POA: Diagnosis not present

## 2023-07-22 DIAGNOSIS — D235 Other benign neoplasm of skin of trunk: Secondary | ICD-10-CM | POA: Diagnosis not present

## 2023-07-22 DIAGNOSIS — D1801 Hemangioma of skin and subcutaneous tissue: Secondary | ICD-10-CM | POA: Diagnosis not present

## 2023-07-22 DIAGNOSIS — L821 Other seborrheic keratosis: Secondary | ICD-10-CM | POA: Diagnosis not present

## 2023-07-22 DIAGNOSIS — B351 Tinea unguium: Secondary | ICD-10-CM | POA: Diagnosis not present

## 2023-07-30 DIAGNOSIS — M25579 Pain in unspecified ankle and joints of unspecified foot: Secondary | ICD-10-CM | POA: Diagnosis not present

## 2023-08-06 DIAGNOSIS — M25579 Pain in unspecified ankle and joints of unspecified foot: Secondary | ICD-10-CM | POA: Diagnosis not present

## 2023-08-20 DIAGNOSIS — M25579 Pain in unspecified ankle and joints of unspecified foot: Secondary | ICD-10-CM | POA: Diagnosis not present

## 2023-09-01 DIAGNOSIS — M25579 Pain in unspecified ankle and joints of unspecified foot: Secondary | ICD-10-CM | POA: Diagnosis not present

## 2023-09-15 DIAGNOSIS — M25579 Pain in unspecified ankle and joints of unspecified foot: Secondary | ICD-10-CM | POA: Diagnosis not present

## 2023-09-17 DIAGNOSIS — Z961 Presence of intraocular lens: Secondary | ICD-10-CM | POA: Diagnosis not present

## 2023-09-17 DIAGNOSIS — H18469 Peripheral corneal degeneration, unspecified eye: Secondary | ICD-10-CM | POA: Diagnosis not present

## 2023-09-17 DIAGNOSIS — H52203 Unspecified astigmatism, bilateral: Secondary | ICD-10-CM | POA: Diagnosis not present

## 2023-10-01 DIAGNOSIS — M25579 Pain in unspecified ankle and joints of unspecified foot: Secondary | ICD-10-CM | POA: Diagnosis not present

## 2023-10-29 ENCOUNTER — Encounter (HOSPITAL_BASED_OUTPATIENT_CLINIC_OR_DEPARTMENT_OTHER): Payer: Self-pay | Admitting: *Deleted

## 2023-10-30 ENCOUNTER — Encounter (HOSPITAL_BASED_OUTPATIENT_CLINIC_OR_DEPARTMENT_OTHER): Payer: Self-pay | Admitting: Obstetrics & Gynecology

## 2023-10-30 DIAGNOSIS — M85851 Other specified disorders of bone density and structure, right thigh: Secondary | ICD-10-CM | POA: Insufficient documentation

## 2023-11-02 DIAGNOSIS — F411 Generalized anxiety disorder: Secondary | ICD-10-CM | POA: Diagnosis not present

## 2023-11-10 DIAGNOSIS — F411 Generalized anxiety disorder: Secondary | ICD-10-CM | POA: Diagnosis not present

## 2023-11-17 DIAGNOSIS — F411 Generalized anxiety disorder: Secondary | ICD-10-CM | POA: Diagnosis not present

## 2023-11-23 DIAGNOSIS — M25561 Pain in right knee: Secondary | ICD-10-CM | POA: Diagnosis not present

## 2023-11-24 DIAGNOSIS — F411 Generalized anxiety disorder: Secondary | ICD-10-CM | POA: Diagnosis not present

## 2023-12-07 DIAGNOSIS — M25561 Pain in right knee: Secondary | ICD-10-CM | POA: Diagnosis not present

## 2023-12-09 DIAGNOSIS — F411 Generalized anxiety disorder: Secondary | ICD-10-CM | POA: Diagnosis not present

## 2023-12-10 ENCOUNTER — Encounter: Payer: Self-pay | Admitting: Podiatry

## 2023-12-10 NOTE — Telephone Encounter (Signed)
Can we schedule her?  Thanks

## 2023-12-11 DIAGNOSIS — M1711 Unilateral primary osteoarthritis, right knee: Secondary | ICD-10-CM | POA: Diagnosis not present

## 2023-12-16 DIAGNOSIS — F411 Generalized anxiety disorder: Secondary | ICD-10-CM | POA: Diagnosis not present

## 2023-12-22 DIAGNOSIS — M25579 Pain in unspecified ankle and joints of unspecified foot: Secondary | ICD-10-CM | POA: Diagnosis not present

## 2023-12-24 ENCOUNTER — Ambulatory Visit: Admitting: Podiatry

## 2023-12-24 DIAGNOSIS — F411 Generalized anxiety disorder: Secondary | ICD-10-CM | POA: Diagnosis not present

## 2023-12-30 DIAGNOSIS — F411 Generalized anxiety disorder: Secondary | ICD-10-CM | POA: Diagnosis not present

## 2023-12-31 DIAGNOSIS — M25579 Pain in unspecified ankle and joints of unspecified foot: Secondary | ICD-10-CM | POA: Diagnosis not present

## 2024-01-05 DIAGNOSIS — M25579 Pain in unspecified ankle and joints of unspecified foot: Secondary | ICD-10-CM | POA: Diagnosis not present

## 2024-01-06 DIAGNOSIS — M1711 Unilateral primary osteoarthritis, right knee: Secondary | ICD-10-CM | POA: Diagnosis not present

## 2024-01-06 DIAGNOSIS — S83231A Complex tear of medial meniscus, current injury, right knee, initial encounter: Secondary | ICD-10-CM | POA: Diagnosis not present

## 2024-01-07 DIAGNOSIS — F411 Generalized anxiety disorder: Secondary | ICD-10-CM | POA: Diagnosis not present

## 2024-01-12 DIAGNOSIS — M25579 Pain in unspecified ankle and joints of unspecified foot: Secondary | ICD-10-CM | POA: Diagnosis not present

## 2024-01-13 DIAGNOSIS — F411 Generalized anxiety disorder: Secondary | ICD-10-CM | POA: Diagnosis not present

## 2024-01-19 DIAGNOSIS — M25579 Pain in unspecified ankle and joints of unspecified foot: Secondary | ICD-10-CM | POA: Diagnosis not present

## 2024-01-20 DIAGNOSIS — F411 Generalized anxiety disorder: Secondary | ICD-10-CM | POA: Diagnosis not present

## 2024-01-26 ENCOUNTER — Ambulatory Visit (HOSPITAL_BASED_OUTPATIENT_CLINIC_OR_DEPARTMENT_OTHER): Payer: Self-pay | Admitting: Obstetrics & Gynecology

## 2024-01-26 ENCOUNTER — Encounter (HOSPITAL_BASED_OUTPATIENT_CLINIC_OR_DEPARTMENT_OTHER): Payer: Self-pay | Admitting: Obstetrics & Gynecology

## 2024-01-26 VITALS — BP 126/75 | HR 79 | Ht 61.0 in | Wt 162.0 lb

## 2024-01-26 DIAGNOSIS — Z658 Other specified problems related to psychosocial circumstances: Secondary | ICD-10-CM | POA: Diagnosis not present

## 2024-01-26 DIAGNOSIS — M25579 Pain in unspecified ankle and joints of unspecified foot: Secondary | ICD-10-CM | POA: Diagnosis not present

## 2024-01-26 DIAGNOSIS — L900 Lichen sclerosus et atrophicus: Secondary | ICD-10-CM

## 2024-01-26 MED ORDER — MOMETASONE FUROATE 0.1 % EX OINT
TOPICAL_OINTMENT | CUTANEOUS | 3 refills | Status: AC
Start: 2024-01-26 — End: ?

## 2024-01-26 MED ORDER — CLOBETASOL PROPIONATE 0.05 % EX CREA
TOPICAL_CREAM | CUTANEOUS | 0 refills | Status: AC
Start: 2024-01-26 — End: ?

## 2024-01-26 NOTE — Progress Notes (Signed)
 GYNECOLOGY  VISIT  CC:   stressors  HPI: 73 y.o. G1P1 Married White or Caucasian female here for complaints of vulvar itching and burning.  She hasn't been using any topical steroid.  Felt she needed to be evaluated.  Denies any bleeding.    She has a lot going on right now.  Husband is in congestive heart failure and has significant kidney function issues.  He's been in and out of the hospital this year.  He is getting ready to start palliative care.  She has been seeing a therapist, Fredrik Jensen.   Past Medical History:  Diagnosis Date   Asthma    Endometriosis    resolved by [facility]   Hyperlipidemia    Lichen sclerosus of female genitalia 2020   Rectal fissure 12/97    MEDS:   Current Outpatient Medications on File Prior to Visit  Medication Sig Dispense Refill   albuterol  (VENTOLIN  HFA) 108 (90 Base) MCG/ACT inhaler Inhale 2 puffs into the lungs every 4 (four) hours as needed for wheezing or shortness of breath. 18 g 1   azelastine  (ASTELIN ) 0.1 % nasal spray spray 1-2 sprays each nostril twice a day as needed for runny nose/drainage down throat 30 mL 5   Cetirizine HCl (ZYRTEC PO) Take by mouth as needed.     diclofenac  sodium (VOLTAREN ) 1 % GEL Apply 2 g topically as directed. 300 Tube 5   Fluticasone-Umeclidin-Vilant (TRELEGY ELLIPTA ) 200-62.5-25 MCG/ACT AEPB Inhale 1 puff into the lungs daily. 28 each 1   mometasone  (ELOCON ) 0.1 % ointment APPLY  OINTMENT TOPICALLY TWICE A WEEK 45 g 3   montelukast  (SINGULAIR ) 10 MG tablet Take 1 tablet (10 mg total) by mouth at bedtime. 30 tablet 5   triamcinolone  (NASACORT ) 55 MCG/ACT AERO nasal inhaler Spray 2 sprays each nostril once a day as needed for stuffy nose 16.5 g 3   clobetasol  cream (TEMOVATE ) 0.05 % Apply thinly to affected area twice daily for 5 - 7 days if has a flare. (Patient not taking: Reported on 01/26/2024) 60 g 0   ezetimibe  (ZETIA ) 10 MG tablet Take 1 tablet (10 mg total) by mouth daily. 90 tablet 3   fluconazole  (DIFLUCAN )  150 MG tablet Take 1 tablet (150 mg total) by mouth once a week. (Patient not taking: Reported on 01/26/2024) 12 tablet 0   No current facility-administered medications on file prior to visit.    ALLERGIES: Other, Latex, Nimbex [atracurium besylate], Praluent  [alirocumab ], Pravastatin, and Rosuvastatin  SH:  married, non smoker  Review of Systems  Constitutional: Negative.   Genitourinary:        Vulvar irritation    PHYSICAL EXAMINATION:    BP 126/75 (BP Location: Left Arm, Patient Position: Sitting, Cuff Size: Normal)   Pulse 79   Ht 5\' 1"  (1.549 m)   Wt 162 lb (73.5 kg)   LMP  (LMP Unknown)   BMI 30.61 kg/m     General appearance: alert, cooperative and appears stated age  Lymph:  no inguinal LAD noted Pelvic: External genitalia:  no lesions              Urethra:  normal appearing urethra with no masses, tenderness or lesions              Bartholins and Skenes: normal                 Vagina:  hypopigmentation at introitus only  Assessment/Plan: 1. Lichen sclerosus et atrophicus (Primary) - pt is going to start clobetasol  today twice daily for 7-14 days until symptoms resolved.  Then she will resume the mometasone  twice weekly - mometasone  (ELOCON ) 0.1 % ointment; APPLY  OINTMENT TOPICALLY TWICE A WEEK  Dispense: 45 g; Refill: 3 - clobetasol  cream (TEMOVATE ) 0.05 %; Apply thinly to affected area twice daily for 7-14 days if has a flare.  Dispense: 60 g; Refill: 0  2. Psychosocial stressors - discussed options for antidepressants.  She has been on zoloft and amitriptyline  in the past.  She amitriptyline  does also help with sleep.  She is going to check the dosage she used in the past and call back and let me know.

## 2024-01-27 ENCOUNTER — Other Ambulatory Visit (HOSPITAL_BASED_OUTPATIENT_CLINIC_OR_DEPARTMENT_OTHER): Payer: Self-pay | Admitting: Obstetrics & Gynecology

## 2024-01-27 DIAGNOSIS — F411 Generalized anxiety disorder: Secondary | ICD-10-CM | POA: Diagnosis not present

## 2024-01-27 DIAGNOSIS — Z658 Other specified problems related to psychosocial circumstances: Secondary | ICD-10-CM

## 2024-01-27 DIAGNOSIS — F5102 Adjustment insomnia: Secondary | ICD-10-CM

## 2024-01-27 MED ORDER — AMITRIPTYLINE HCL 10 MG PO TABS
ORAL_TABLET | ORAL | 0 refills | Status: DC
Start: 2024-01-27 — End: 2024-02-08

## 2024-01-31 DIAGNOSIS — M25579 Pain in unspecified ankle and joints of unspecified foot: Secondary | ICD-10-CM | POA: Diagnosis not present

## 2024-02-01 DIAGNOSIS — F411 Generalized anxiety disorder: Secondary | ICD-10-CM | POA: Diagnosis not present

## 2024-02-02 DIAGNOSIS — M25579 Pain in unspecified ankle and joints of unspecified foot: Secondary | ICD-10-CM | POA: Diagnosis not present

## 2024-02-04 ENCOUNTER — Encounter (HOSPITAL_BASED_OUTPATIENT_CLINIC_OR_DEPARTMENT_OTHER): Payer: Self-pay | Admitting: Obstetrics & Gynecology

## 2024-02-08 ENCOUNTER — Other Ambulatory Visit (HOSPITAL_BASED_OUTPATIENT_CLINIC_OR_DEPARTMENT_OTHER): Payer: Self-pay | Admitting: Obstetrics & Gynecology

## 2024-02-08 ENCOUNTER — Telehealth (HOSPITAL_BASED_OUTPATIENT_CLINIC_OR_DEPARTMENT_OTHER): Payer: Self-pay | Admitting: Obstetrics & Gynecology

## 2024-02-08 DIAGNOSIS — F411 Generalized anxiety disorder: Secondary | ICD-10-CM | POA: Diagnosis not present

## 2024-02-08 DIAGNOSIS — F5102 Adjustment insomnia: Secondary | ICD-10-CM

## 2024-02-08 DIAGNOSIS — Z658 Other specified problems related to psychosocial circumstances: Secondary | ICD-10-CM

## 2024-02-08 MED ORDER — AMITRIPTYLINE HCL 10 MG PO TABS
10.0000 mg | ORAL_TABLET | Freq: Every evening | ORAL | 2 refills | Status: DC | PRN
Start: 2024-02-08 — End: 2024-03-30

## 2024-02-08 NOTE — Telephone Encounter (Signed)
 Called pt personally.  She is sleeping better and feels she doesn't need to go up in the dosage.  Anxiety is better too.  Having some mild dry mouth but that is very manageable.  Does need refill sent to [facility].  #30/2RF done.  Asked her to let me know if desires to continue and when needs RF.

## 2024-02-09 ENCOUNTER — Ambulatory Visit (HOSPITAL_BASED_OUTPATIENT_CLINIC_OR_DEPARTMENT_OTHER): Payer: Medicare Other | Admitting: Family

## 2024-02-11 DIAGNOSIS — M25579 Pain in unspecified ankle and joints of unspecified foot: Secondary | ICD-10-CM | POA: Diagnosis not present

## 2024-02-15 DIAGNOSIS — F411 Generalized anxiety disorder: Secondary | ICD-10-CM | POA: Diagnosis not present

## 2024-02-17 ENCOUNTER — Encounter (HOSPITAL_BASED_OUTPATIENT_CLINIC_OR_DEPARTMENT_OTHER): Payer: Self-pay | Admitting: Obstetrics & Gynecology

## 2024-02-18 DIAGNOSIS — M25579 Pain in unspecified ankle and joints of unspecified foot: Secondary | ICD-10-CM | POA: Diagnosis not present

## 2024-02-22 DIAGNOSIS — F411 Generalized anxiety disorder: Secondary | ICD-10-CM | POA: Diagnosis not present

## 2024-02-23 DIAGNOSIS — M25579 Pain in unspecified ankle and joints of unspecified foot: Secondary | ICD-10-CM | POA: Diagnosis not present

## 2024-02-25 ENCOUNTER — Encounter (HOSPITAL_BASED_OUTPATIENT_CLINIC_OR_DEPARTMENT_OTHER): Payer: Self-pay | Admitting: Obstetrics & Gynecology

## 2024-02-25 ENCOUNTER — Ambulatory Visit: Admitting: Podiatry

## 2024-02-29 DIAGNOSIS — M25579 Pain in unspecified ankle and joints of unspecified foot: Secondary | ICD-10-CM | POA: Diagnosis not present

## 2024-03-07 ENCOUNTER — Encounter: Payer: Self-pay | Admitting: Podiatry

## 2024-03-07 ENCOUNTER — Ambulatory Visit: Admitting: Podiatry

## 2024-03-07 DIAGNOSIS — T148XXA Other injury of unspecified body region, initial encounter: Secondary | ICD-10-CM

## 2024-03-07 DIAGNOSIS — B351 Tinea unguium: Secondary | ICD-10-CM

## 2024-03-07 DIAGNOSIS — F411 Generalized anxiety disorder: Secondary | ICD-10-CM | POA: Diagnosis not present

## 2024-03-08 DIAGNOSIS — M25579 Pain in unspecified ankle and joints of unspecified foot: Secondary | ICD-10-CM | POA: Diagnosis not present

## 2024-03-09 NOTE — Progress Notes (Signed)
 Subjective: Chief Complaint  Patient presents with   Nail Problem    RM#14 Left foot second toe fungus also right blood blister.    73 year old female presents the office for the above concerns.  She is doing amazing topical medication to the left second toenail still thickened discolored is asking for can be trimmed back to help facilitate the healing.  No drainage or pus.   On the right foot on the heel there is what appears to be a blood blister.  She does not recall stepping any foreign objects and no swelling or redness or any drainage.  Physical therapy noticed this.   ROS: Negative except otherwise mentioned above.  Objective: AAO x3, NAD DP/PT pulses palpable bilaterally, CRT less than 3 seconds The left second digit toenail in particular the skin is hypertrophic, dystrophic with yellow, brown discoloration.  No edema, erythema.   Plantar as of the right heel is what looks like a dried blood, scabbed area.  Once I debrided this there is new, healthy, pink skin underneath this.  No foreign body identified there is no puncture wound or ulceration.   No pain with calf compression, swelling, warmth, erythema  Assessment: 73 year old female with hallux malleus, onychomycosis, Achilles tendinitis  Plan: -All treatment options discussed with the patient including all alternatives, risks, complications.  -I sharply debrided the left second digit nail without any complications or bleeding.  She is to continue with topical medication. -Debrided the area of the dried blood, old blister on the heel without any complications or bleeding.  No foreign body or any signs of infection noted today.  Return if symptoms worsen or fail to improve.  Charity Conch DPM

## 2024-03-15 DIAGNOSIS — F411 Generalized anxiety disorder: Secondary | ICD-10-CM | POA: Diagnosis not present

## 2024-03-17 DIAGNOSIS — M25579 Pain in unspecified ankle and joints of unspecified foot: Secondary | ICD-10-CM | POA: Diagnosis not present

## 2024-03-24 DIAGNOSIS — M25579 Pain in unspecified ankle and joints of unspecified foot: Secondary | ICD-10-CM | POA: Diagnosis not present

## 2024-03-28 ENCOUNTER — Encounter (HOSPITAL_BASED_OUTPATIENT_CLINIC_OR_DEPARTMENT_OTHER): Payer: Self-pay | Admitting: Obstetrics & Gynecology

## 2024-03-29 DIAGNOSIS — F411 Generalized anxiety disorder: Secondary | ICD-10-CM | POA: Diagnosis not present

## 2024-03-30 ENCOUNTER — Other Ambulatory Visit (HOSPITAL_BASED_OUTPATIENT_CLINIC_OR_DEPARTMENT_OTHER): Payer: Self-pay | Admitting: Certified Nurse Midwife

## 2024-03-30 DIAGNOSIS — F5102 Adjustment insomnia: Secondary | ICD-10-CM

## 2024-03-30 DIAGNOSIS — Z658 Other specified problems related to psychosocial circumstances: Secondary | ICD-10-CM

## 2024-03-30 MED ORDER — AMITRIPTYLINE HCL 25 MG PO TABS: 25.0000 mg | ORAL_TABLET | Freq: Every day | ORAL | 1 refills | Status: DC

## 2024-03-31 DIAGNOSIS — M25579 Pain in unspecified ankle and joints of unspecified foot: Secondary | ICD-10-CM | POA: Diagnosis not present

## 2024-04-05 DIAGNOSIS — M25579 Pain in unspecified ankle and joints of unspecified foot: Secondary | ICD-10-CM | POA: Diagnosis not present

## 2024-04-07 ENCOUNTER — Ambulatory Visit (HOSPITAL_BASED_OUTPATIENT_CLINIC_OR_DEPARTMENT_OTHER): Payer: Self-pay | Admitting: Obstetrics & Gynecology

## 2024-04-12 DIAGNOSIS — F411 Generalized anxiety disorder: Secondary | ICD-10-CM | POA: Diagnosis not present

## 2024-04-13 DIAGNOSIS — M25579 Pain in unspecified ankle and joints of unspecified foot: Secondary | ICD-10-CM | POA: Diagnosis not present

## 2024-04-20 DIAGNOSIS — M25579 Pain in unspecified ankle and joints of unspecified foot: Secondary | ICD-10-CM | POA: Diagnosis not present

## 2024-04-25 DIAGNOSIS — F411 Generalized anxiety disorder: Secondary | ICD-10-CM | POA: Diagnosis not present

## 2024-04-26 DIAGNOSIS — M25579 Pain in unspecified ankle and joints of unspecified foot: Secondary | ICD-10-CM | POA: Diagnosis not present

## 2024-05-03 DIAGNOSIS — M25579 Pain in unspecified ankle and joints of unspecified foot: Secondary | ICD-10-CM | POA: Diagnosis not present

## 2024-05-09 DIAGNOSIS — F411 Generalized anxiety disorder: Secondary | ICD-10-CM | POA: Diagnosis not present

## 2024-05-11 DIAGNOSIS — M25579 Pain in unspecified ankle and joints of unspecified foot: Secondary | ICD-10-CM | POA: Diagnosis not present

## 2024-05-18 DIAGNOSIS — M25579 Pain in unspecified ankle and joints of unspecified foot: Secondary | ICD-10-CM | POA: Diagnosis not present

## 2024-05-18 NOTE — Progress Notes (Signed)
 Cardiology Office Note:    Date:  05/20/2024   ID:  Christy Rojas, DOB 1951-01-27, MRN [id number]  PCP:  Kip Righter, MD   [facility] Providers Cardiologist:  Shelda Bruckner, MD     Referring MD: Kip Righter, MD   Chief complaint: 1 year follow-up  History of Present Illness:    Christy Rojas is a 73 y.o. female with a hx of nonobstructive CAD, hyperlipidemia, asthma, here today for a 1 year follow-up.  History of a negative stress test in 2014, has a known statin intolerance (pravastatin, rosuvastatin, and Repatha, regardless of co-Q10 administration) in the past due to myalgias, coronary CT 12/24/2019 revealed coronary calcium score of 7.4 (56% for age, sex matched controls).  TTE 04/24/2022 for monitoring of MR showed EF 55 to 60%, G1 DD, normal RV, mild MR, trivial AR.  The plan at her last visit on 07/21/2023 was to have her start Zetia  10 mg and have a repeat fasting lipid panel checked in 6 months.  She does have a history of mild MR per her 2023 echo, with plans to recheck in 2028 or sooner if symptomatic.  Patient presents for follow-up, appears stable from a cardiovascular standpoint.  Denies chest pain, shortness of breath, orthopnea, palpitations, nausea, dark/bloody stools, dizziness, near-syncope, leg swelling.  At previous visit, was prescribed a trial of Zetia  for hyperlipidemia, patient reports that she was unable to tolerated due to cramping in her legs.  Patient reports increased stressors in her daily life with the poor health of her husband, as well as frustrations with her right knee injury in February of this year which she believes is exacerbating her left ankle Achilles tendinitis.  This is preventing her from playing tennis, which she reports having been an avid player for most of her life.  Does attend physical therapy once a week and does exercises at home, with little relief in the knee or ankle discomfort.  Uses meloxicam to manage the  symptoms.  Reported concerns of her BP on this visit, initially 144/78, on recheck in room was 138/78, denies any prior history of hypertension.   Past Medical History:  Diagnosis Date   Asthma    Endometriosis    resolved by [facility]   Hyperlipidemia    Lichen sclerosus of female genitalia 2020   Rectal fissure 12/97    Past Surgical History:  Procedure Laterality Date   ABDOMINAL HYSTERECTOMY  2000   BSO   TONSILLECTOMY AND ADENOIDECTOMY  10/07/1959    Current Medications: Current Meds  Medication Sig   albuterol  (VENTOLIN  HFA) 108 (90 Base) MCG/ACT inhaler Inhale 2 puffs into the lungs every 4 (four) hours as needed for wheezing or shortness of breath.   amitriptyline  (ELAVIL ) 25 MG tablet Take 1 tablet (25 mg total) by mouth at bedtime. (Patient taking differently: Take 10 mg by mouth at bedtime.)   azelastine  (ASTELIN ) 0.1 % nasal spray spray 1-2 sprays each nostril twice a day as needed for runny nose/drainage down throat   Bempedoic Acid  (NEXLETOL ) 180 MG TABS Take 1 tablet (180 mg total) by mouth daily.   Cetirizine HCl (ZYRTEC PO) Take by mouth as needed.   clobetasol  cream (TEMOVATE ) 0.05 % Apply thinly to affected area twice daily for 7-14 days if has a flare.   diclofenac  sodium (VOLTAREN ) 1 % GEL Apply 2 g topically as directed.   Fluticasone-Umeclidin-Vilant (TRELEGY ELLIPTA ) 200-62.5-25 MCG/ACT AEPB Inhale 1 puff into the lungs daily.   mometasone  (ELOCON ) 0.1 %  ointment APPLY  OINTMENT TOPICALLY TWICE A WEEK   montelukast  (SINGULAIR ) 10 MG tablet Take 1 tablet (10 mg total) by mouth at bedtime.   triamcinolone  (NASACORT ) 55 MCG/ACT AERO nasal inhaler Spray 2 sprays each nostril once a day as needed for stuffy nose     Allergies:   Other, Latex, Nimbex [atracurium besylate], Praluent  [alirocumab ], Pravastatin, and Rosuvastatin   Social History   Socioeconomic History   Marital status: Married    Spouse name: Not on file   Number of children: Not on file   Years  of education: Not on file   Highest education level: Not on file  Occupational History   Not on file  Tobacco Use   Smoking status: Never   Smokeless tobacco: Never  Vaping Use   Vaping status: Never Used  Substance and Sexual Activity   Alcohol use: Yes    Alcohol/week: 1.0 standard drink of alcohol    Types: 1 Standard drinks or equivalent per week    Comment: occasionally   Drug use: Never   Sexual activity: Not Currently    Partners: Male    Birth control/protection: Surgical    Comment: hysterectomy  Other Topics Concern   Not on file  Social History Narrative   Not on file   Social Drivers of Health   Financial Resource Strain: Not on file  Food Insecurity: Not on file  Transportation Needs: Not on file  Physical Activity: Not on file  Stress: Not on file  Social Connections: Not on file     Family History: The patient's family history includes Breast cancer (age of onset: 64) in her mother.  ROS:   Please see the history of present illness.     All other systems reviewed and are negative.  EKGs/Labs/Other Studies Reviewed:    The following studies were reviewed today:  Recent Labs: 05/28/2023: ALT 18; Hemoglobin 13.9; Platelets 364  Recent Lipid Panel    Component Value Date/Time   CHOL 228 (H) 05/12/2023 0858   TRIG 88 05/12/2023 0858   HDL 60 05/12/2023 0858   CHOLHDL 3.8 05/12/2023 0858   LDLCALC 153 (H) 05/12/2023 0858    Her Functional Capacity in METs is: 5.07 according to the Duke Activity Status Index (DASI).   Physical Exam:    VS:  BP (!) 144/78 (BP Location: Left Arm, Patient Position: Sitting, Cuff Size: Normal)   Pulse 67   Resp 16   Ht 5' 1 (1.549 m)   Wt 162 lb 4.8 oz (73.6 kg)   LMP  (LMP Unknown)   SpO2 95%   BMI 30.67 kg/m     Wt Readings from Last 3 Encounters:  05/20/24 162 lb 4.8 oz (73.6 kg)  01/26/24 162 lb (73.5 kg)  07/21/23 166 lb 12.8 oz (75.7 kg)     GEN: Well nourished, well developed in no acute  distress HEENT: Normal NECK: No carotid bruits CARDIAC: S1-S2 normal, RRR, no murmurs, rubs, gallops, 2+ radial, DP/PT pulses RESPIRATORY:  Clear to auscultation without rales, wheezing or rhonchi  ABDOMEN: Non-distended MUSCULOSKELETAL:  No edema; No deformity  SKIN: Warm and dry NEUROLOGIC:  Alert and oriented x 3 PSYCHIATRIC:  Normal affect   ASSESSMENT:    1. Elevated coronary artery calcium score   2. Mitral valve insufficiency, unspecified etiology   3. Mixed hyperlipidemia   4. Physical deconditioning    PLAN:    In order of problems listed above:  Elevated coronary artery calcium score -Coronary calcium score  of 7.4 (56th percentile) on 12/30/2019 -Denies chest pain, shortness of breath, dizziness -Will continue to monitor for new symptoms -Lipid management as below  Mitral valve regurgitation - TTE 04/24/2022 showed mild mitral valve regurgitation, could consider repeat echo if she develops DOE, chest pain, increased fatigue, dizziness, lightheadedness, leg swelling, or other new/questionable/concerning symptoms  Hyperlipidemia with LDL goal <70 - Historical intolerance of lipids, Repatha, and most recently Zetia  - Will trial patient on Nexlitol and repeat lipid panel in 2-3 months   4.   Physical deconditioning   - Right knee injury exacerbating left achilles tendonitis  - Preventing her from playing tennis, goes to PT once/week - Will refer to PrEP to promote physical activity tailored to her physical limitations  Follow-up in 6 months     Medication Adjustments/Labs and Tests Ordered: Current medicines are reviewed at length with the patient today.  Concerns regarding medicines are outlined above.  Orders Placed This Encounter  Procedures   Lipid Profile   Amb Referral To Provider Referral Exercise Program (P.R.E.P)   Meds ordered this encounter  Medications   Bempedoic Acid  (NEXLETOL ) 180 MG TABS    Sig: Take 1 tablet (180 mg total) by mouth daily.     Dispense:  30 tablet    Refill:  6    Patient Instructions  Medication Instructions:  Your physician has recommended you make the following change in your medication:  1-START Nexletol  180 mg by mouth daily  *If you need a refill on your cardiac medications before your next appointment, please call your pharmacy*  Lab Work: Your physician recommends that you return for lab work in: 2 to 3 months for fasting lipid panel. If you have labs (blood work) drawn today and your tests are completely normal, you will receive your results only by: MyChart Message (if you have MyChart) OR A paper copy in the mail If you have any lab test that is abnormal or we need to change your treatment, we will call you to review the results.  Testing/Procedures: None ordered today.  Follow-Up: At [facility], you and your health needs are our priority.  As part of our continuing mission to provide you with exceptional heart care, our providers are all part of one team.  This team includes your primary Cardiologist (physician) and Advanced Practice Providers or APPs (Physician Assistants and Nurse Practitioners) who all work together to provide you with the care you need, when you need it.  Your next appointment:   6 month(s)  Provider:   Shelda Bruckner, MD    We recommend signing up for the patient portal called MyChart.  Sign up information is provided on this After Visit Summary.  MyChart is used to connect with patients for Virtual Visits (Telemedicine).  Patients are able to view lab/test results, encounter notes, upcoming appointments, etc.  Non-urgent messages can be sent to your provider as well.   To learn more about what you can do with MyChart, go to [email or web address].   Other Instructions Please keep a log of your blood pressures.       Signed, Miriam FORBES Shams, NP  05/20/2024 4:52 PM    [facility]

## 2024-05-20 ENCOUNTER — Ambulatory Visit (HOSPITAL_BASED_OUTPATIENT_CLINIC_OR_DEPARTMENT_OTHER): Admitting: Emergency Medicine

## 2024-05-20 ENCOUNTER — Encounter (HOSPITAL_BASED_OUTPATIENT_CLINIC_OR_DEPARTMENT_OTHER): Payer: Self-pay

## 2024-05-20 ENCOUNTER — Encounter (HOSPITAL_BASED_OUTPATIENT_CLINIC_OR_DEPARTMENT_OTHER): Payer: Self-pay | Admitting: Emergency Medicine

## 2024-05-20 ENCOUNTER — Other Ambulatory Visit (HOSPITAL_COMMUNITY): Payer: Self-pay

## 2024-05-20 ENCOUNTER — Telehealth (HOSPITAL_BASED_OUTPATIENT_CLINIC_OR_DEPARTMENT_OTHER): Payer: Self-pay

## 2024-05-20 ENCOUNTER — Telehealth: Payer: Self-pay | Admitting: Pharmacy Technician

## 2024-05-20 VITALS — BP 144/78 | HR 67 | Resp 16 | Ht 61.0 in | Wt 162.3 lb

## 2024-05-20 DIAGNOSIS — R931 Abnormal findings on diagnostic imaging of heart and coronary circulation: Secondary | ICD-10-CM

## 2024-05-20 DIAGNOSIS — R5381 Other malaise: Secondary | ICD-10-CM

## 2024-05-20 DIAGNOSIS — E782 Mixed hyperlipidemia: Secondary | ICD-10-CM | POA: Diagnosis not present

## 2024-05-20 DIAGNOSIS — I340 Nonrheumatic mitral (valve) insufficiency: Secondary | ICD-10-CM | POA: Diagnosis not present

## 2024-05-20 MED ORDER — NEXLETOL 180 MG PO TABS: 1.0000 | ORAL_TABLET | Freq: Every day | ORAL | 6 refills | Status: DC

## 2024-05-20 NOTE — Telephone Encounter (Signed)
 Placed on hold to wait for updated labs- notified offfice

## 2024-05-20 NOTE — Telephone Encounter (Signed)
 Patient was started on Nexletol  180 mg daily.

## 2024-05-20 NOTE — Patient Instructions (Signed)
 Medication Instructions:  Your physician has recommended you make the following change in your medication:  1-START Nexletol  180 mg by mouth daily  *If you need a refill on your cardiac medications before your next appointment, please call your pharmacy*  Lab Work: Your physician recommends that you return for lab work in: 2 to 3 months for fasting lipid panel. If you have labs (blood work) drawn today and your tests are completely normal, you will receive your results only by: MyChart Message (if you have MyChart) OR A paper copy in the mail If you have any lab test that is abnormal or we need to change your treatment, we will call you to review the results.  Testing/Procedures: None ordered today.  Follow-Up: At [facility], you and your health needs are our priority.  As part of our continuing mission to provide you with exceptional heart care, our providers are all part of one team.  This team includes your primary Cardiologist (physician) and Advanced Practice Providers or APPs (Physician Assistants and Nurse Practitioners) who all work together to provide you with the care you need, when you need it.  Your next appointment:   6 month(s)  Provider:   Shelda Bruckner, MD    We recommend signing up for the patient portal called MyChart.  Sign up information is provided on this After Visit Summary.  MyChart is used to connect with patients for Virtual Visits (Telemedicine).  Patients are able to view lab/test results, encounter notes, upcoming appointments, etc.  Non-urgent messages can be sent to your provider as well.   To learn more about what you can do with MyChart, go to [email or web address].   Other Instructions Please keep a log of your blood pressures.

## 2024-05-20 NOTE — Telephone Encounter (Signed)
 Left message for patient to call back. She will need to get lipid panel in the next couple of days to get approved for new medication, Nexletol .

## 2024-05-20 NOTE — Telephone Encounter (Signed)
 Pharmacy Patient Advocate Encounter   Received notification from Pt Calls Messages that prior authorization for nexletol  is required/requested.   Insurance verification completed.   The patient is insured through [employer] [facility] ADVANTAGE/RX ADVANCE .   Per test claim: PA required; PA submitted to above mentioned insurance via Latent Key/confirmation #/EOC [employer] Status is pending

## 2024-05-23 NOTE — Telephone Encounter (Signed)
 Labs ordered patient made aware via MyChart. Will remove from triage pool.

## 2024-05-25 DIAGNOSIS — M25579 Pain in unspecified ankle and joints of unspecified foot: Secondary | ICD-10-CM | POA: Diagnosis not present

## 2024-06-01 ENCOUNTER — Telehealth: Payer: Self-pay

## 2024-06-01 DIAGNOSIS — M25579 Pain in unspecified ankle and joints of unspecified foot: Secondary | ICD-10-CM | POA: Diagnosis not present

## 2024-06-01 NOTE — Telephone Encounter (Signed)
 Left message about wanting to discuss PREP Program. Asked her to return my call.

## 2024-06-09 DIAGNOSIS — M25571 Pain in right ankle and joints of right foot: Secondary | ICD-10-CM | POA: Diagnosis not present

## 2024-06-13 ENCOUNTER — Other Ambulatory Visit (HOSPITAL_BASED_OUTPATIENT_CLINIC_OR_DEPARTMENT_OTHER): Payer: Self-pay | Admitting: Obstetrics & Gynecology

## 2024-06-13 DIAGNOSIS — Z658 Other specified problems related to psychosocial circumstances: Secondary | ICD-10-CM

## 2024-06-13 DIAGNOSIS — F5102 Adjustment insomnia: Secondary | ICD-10-CM

## 2024-06-15 DIAGNOSIS — M25579 Pain in unspecified ankle and joints of unspecified foot: Secondary | ICD-10-CM | POA: Diagnosis not present

## 2024-06-16 DIAGNOSIS — F411 Generalized anxiety disorder: Secondary | ICD-10-CM | POA: Diagnosis not present

## 2024-06-21 DIAGNOSIS — S83231A Complex tear of medial meniscus, current injury, right knee, initial encounter: Secondary | ICD-10-CM | POA: Diagnosis not present

## 2024-06-21 DIAGNOSIS — M25571 Pain in right ankle and joints of right foot: Secondary | ICD-10-CM | POA: Diagnosis not present

## 2024-06-29 DIAGNOSIS — M25579 Pain in unspecified ankle and joints of unspecified foot: Secondary | ICD-10-CM | POA: Diagnosis not present

## 2024-06-30 DIAGNOSIS — F411 Generalized anxiety disorder: Secondary | ICD-10-CM | POA: Diagnosis not present

## 2024-07-04 DIAGNOSIS — M25579 Pain in unspecified ankle and joints of unspecified foot: Secondary | ICD-10-CM | POA: Diagnosis not present

## 2024-07-12 DIAGNOSIS — E785 Hyperlipidemia, unspecified: Secondary | ICD-10-CM | POA: Diagnosis not present

## 2024-07-12 DIAGNOSIS — Z0000 Encounter for general adult medical examination without abnormal findings: Secondary | ICD-10-CM | POA: Diagnosis not present

## 2024-07-12 DIAGNOSIS — E559 Vitamin D deficiency, unspecified: Secondary | ICD-10-CM | POA: Diagnosis not present

## 2024-07-12 DIAGNOSIS — Z79899 Other long term (current) drug therapy: Secondary | ICD-10-CM | POA: Diagnosis not present

## 2024-07-12 DIAGNOSIS — D649 Anemia, unspecified: Secondary | ICD-10-CM | POA: Diagnosis not present

## 2024-07-12 DIAGNOSIS — R7303 Prediabetes: Secondary | ICD-10-CM | POA: Diagnosis not present

## 2024-07-14 DIAGNOSIS — Z23 Encounter for immunization: Secondary | ICD-10-CM | POA: Diagnosis not present

## 2024-07-14 DIAGNOSIS — J45909 Unspecified asthma, uncomplicated: Secondary | ICD-10-CM | POA: Diagnosis not present

## 2024-07-14 DIAGNOSIS — K219 Gastro-esophageal reflux disease without esophagitis: Secondary | ICD-10-CM | POA: Diagnosis not present

## 2024-07-14 DIAGNOSIS — E559 Vitamin D deficiency, unspecified: Secondary | ICD-10-CM | POA: Diagnosis not present

## 2024-07-14 DIAGNOSIS — R7303 Prediabetes: Secondary | ICD-10-CM | POA: Diagnosis not present

## 2024-07-14 DIAGNOSIS — G4700 Insomnia, unspecified: Secondary | ICD-10-CM | POA: Diagnosis not present

## 2024-07-14 DIAGNOSIS — F411 Generalized anxiety disorder: Secondary | ICD-10-CM | POA: Diagnosis not present

## 2024-07-14 DIAGNOSIS — Z0000 Encounter for general adult medical examination without abnormal findings: Secondary | ICD-10-CM | POA: Diagnosis not present

## 2024-07-14 DIAGNOSIS — E785 Hyperlipidemia, unspecified: Secondary | ICD-10-CM | POA: Diagnosis not present

## 2024-07-19 DIAGNOSIS — M25579 Pain in unspecified ankle and joints of unspecified foot: Secondary | ICD-10-CM | POA: Diagnosis not present

## 2024-07-25 DIAGNOSIS — L821 Other seborrheic keratosis: Secondary | ICD-10-CM | POA: Diagnosis not present

## 2024-07-25 DIAGNOSIS — L814 Other melanin hyperpigmentation: Secondary | ICD-10-CM | POA: Diagnosis not present

## 2024-07-25 DIAGNOSIS — D225 Melanocytic nevi of trunk: Secondary | ICD-10-CM | POA: Diagnosis not present

## 2024-07-25 DIAGNOSIS — L720 Epidermal cyst: Secondary | ICD-10-CM | POA: Diagnosis not present

## 2024-07-25 DIAGNOSIS — D2361 Other benign neoplasm of skin of right upper limb, including shoulder: Secondary | ICD-10-CM | POA: Diagnosis not present

## 2024-07-27 ENCOUNTER — Telehealth: Payer: Self-pay

## 2024-07-27 NOTE — Telephone Encounter (Signed)
 Left message about PREP program and asked her to return my call.

## 2024-07-28 ENCOUNTER — Telehealth: Payer: Self-pay

## 2024-07-28 DIAGNOSIS — F411 Generalized anxiety disorder: Secondary | ICD-10-CM | POA: Diagnosis not present

## 2024-07-28 DIAGNOSIS — M25579 Pain in unspecified ankle and joints of unspecified foot: Secondary | ICD-10-CM | POA: Diagnosis not present

## 2024-07-28 NOTE — Telephone Encounter (Signed)
 Christy Rojas is not interested in taking PREP at this time. I told her she can contact me back at any time if she changes her mind.

## 2024-08-02 ENCOUNTER — Encounter (HOSPITAL_BASED_OUTPATIENT_CLINIC_OR_DEPARTMENT_OTHER): Payer: Self-pay

## 2024-08-02 DIAGNOSIS — M25579 Pain in unspecified ankle and joints of unspecified foot: Secondary | ICD-10-CM | POA: Diagnosis not present

## 2024-08-02 NOTE — Telephone Encounter (Signed)
 Please request updated labs from PCP, when received we will need to update the Prior Auth team to use those labs to complete the Prior Auth.  Advised patient as far as the palpitations, to stay well-hydrated, limit caffeine, get plenty of rest, if palpitations continue scheduled follow-up visit.  Christy Rojas E Shahin Knierim, NP

## 2024-08-09 DIAGNOSIS — L309 Dermatitis, unspecified: Secondary | ICD-10-CM | POA: Diagnosis not present

## 2024-08-09 DIAGNOSIS — T63441A Toxic effect of venom of bees, accidental (unintentional), initial encounter: Secondary | ICD-10-CM | POA: Diagnosis not present

## 2024-08-11 DIAGNOSIS — F411 Generalized anxiety disorder: Secondary | ICD-10-CM | POA: Diagnosis not present

## 2024-08-16 ENCOUNTER — Other Ambulatory Visit (HOSPITAL_COMMUNITY): Payer: Self-pay

## 2024-08-16 ENCOUNTER — Telehealth: Payer: Self-pay | Admitting: Pharmacy Technician

## 2024-08-16 DIAGNOSIS — M25579 Pain in unspecified ankle and joints of unspecified foot: Secondary | ICD-10-CM | POA: Diagnosis not present

## 2024-08-16 NOTE — Telephone Encounter (Signed)
 Given high cost, would it be possible to submit for [employer]? Her cost is $100 per month or $250/90 days with her medicare plan even after meeting her Rx deductible.   Thanks,  Reche GORMAN Finder, NP

## 2024-08-16 NOTE — Telephone Encounter (Signed)
 Pharmacy Patient Advocate Encounter  Received notification from [employer]/RX ADVANCE that Prior Authorization for Nexletol  has been APPROVED from 08/16/24 to 02/12/25. Ran test claim, Copay is $100.00- one month. This test claim was processed through [facility]- copay amounts may vary at other pharmacies due to pharmacy/plan contracts, or as the patient moves through the different stages of their insurance plan.   PA #/Case ID/Reference #: [id number]

## 2024-08-16 NOTE — Telephone Encounter (Signed)
 Patient Advocate Encounter   The patient was approved for a [employer] grant that will help cover the cost of nexletol  Total amount awarded, 2500.  Effective: 07/17/24 - 07/16/25   APW:[id number] ERW:[id number] Hmnle:[id number] PI:[id number] [employer] ID: [id number]   Pharmacy provided with approval and processing information. Patient informed via mychart

## 2024-08-19 ENCOUNTER — Encounter (HOSPITAL_BASED_OUTPATIENT_CLINIC_OR_DEPARTMENT_OTHER): Payer: Self-pay

## 2024-08-19 DIAGNOSIS — R198 Other specified symptoms and signs involving the digestive system and abdomen: Secondary | ICD-10-CM | POA: Diagnosis not present

## 2024-08-19 DIAGNOSIS — R1013 Epigastric pain: Secondary | ICD-10-CM | POA: Diagnosis not present

## 2024-08-29 DIAGNOSIS — M25579 Pain in unspecified ankle and joints of unspecified foot: Secondary | ICD-10-CM | POA: Diagnosis not present

## 2024-09-09 ENCOUNTER — Encounter (HOSPITAL_BASED_OUTPATIENT_CLINIC_OR_DEPARTMENT_OTHER): Payer: Self-pay | Admitting: Obstetrics & Gynecology

## 2024-09-12 ENCOUNTER — Other Ambulatory Visit (HOSPITAL_BASED_OUTPATIENT_CLINIC_OR_DEPARTMENT_OTHER): Payer: Self-pay | Admitting: Obstetrics & Gynecology

## 2024-09-12 MED ORDER — AMITRIPTYLINE HCL 25 MG PO TABS: 25.0000 mg | ORAL_TABLET | Freq: Every day | ORAL | 2 refills | Status: AC

## 2024-09-16 ENCOUNTER — Ambulatory Visit: Attending: Cardiology | Admitting: Cardiology

## 2024-09-16 ENCOUNTER — Encounter: Payer: Self-pay | Admitting: Cardiology

## 2024-09-16 VITALS — BP 104/60 | HR 82 | Ht 61.0 in | Wt 156.0 lb

## 2024-09-16 DIAGNOSIS — R931 Abnormal findings on diagnostic imaging of heart and coronary circulation: Secondary | ICD-10-CM | POA: Diagnosis not present

## 2024-09-16 DIAGNOSIS — E782 Mixed hyperlipidemia: Secondary | ICD-10-CM

## 2024-09-16 DIAGNOSIS — R002 Palpitations: Secondary | ICD-10-CM | POA: Diagnosis not present

## 2024-09-16 NOTE — Progress Notes (Signed)
 Cardiology Office Note:  .   Date:  09/16/2024  ID:  Christy Rojas, DOB 1950/11/05, MRN [id number] PCP: Kip Righter, MD  [facility] Providers Cardiologist:  Shelda Bruckner, MD {  History of Present Illness: Christy   Christy Rojas is a 73 y.o. female with history of elevated CAC score, mitral regurgitation, hyperlipidemia with statin intolerance, asthma     CAC 2014 negative stress test 12/2019 CAC score of 7.4. 04/2022 EF normal     Patient with history of elevated CAC score 7.4.  Last seen in office 05/2024 and doing well from cardiac standpoint without any complaints.  Zetia  was previously trialed but did not tolerate due to cramping in her legs and has multiple intolerances to statins.  Reported frustration with right knee injury which was preventing her from playing tennis.    Patient is here today with complaints of palpitations.  She reports that she has been stressed recently given that her husband who also is a patient with our practice has been having issues with his health.  She reported that a month ago she had a couple episodes of palpitations that were very brief and only lasted for couple seconds without any recurrences.  Denied any other complaints and denied any exertional symptoms.  Normally she is very active and plays tennis but a little bit limited by knee related pain but still continues to play tennis.  Additionally, she tried Nexletol  but stopped it due to GI related complaints and spasms.  Reports all of the lipid-lowering cholesterol medications have caused her to have spasms.  No history of smoking, alcohol, nondiabetic.  ROS: Denies: Chest pain, shortness of breath, orthopnea, peripheral edema, decreased exercise intolerance, fatigue, lightheadedness.   Studies Reviewed: Christy    EKG Interpretation Date/Time:  Friday September 16 2024 13:49:45 EST Ventricular Rate:  84 PR Interval:  144 QRS Duration:  80 QT Interval:  348 QTC  Calculation: 411 R Axis:   -7  Text Interpretation: Normal sinus rhythm Minimal voltage criteria for LVH, may be normal variant ( R in aVL ) When compared with ECG of 21-Jul-2023 10:42, No significant change was found Confirmed by Darryle Currier [phone]) on 09/16/2024 1:59:43 PM    Risk Assessment/Calculations:           Physical Exam:   VS:  BP 104/60   Pulse 82   Ht 5' 1 (1.549 m)   Wt 156 lb (70.8 kg)   LMP  (LMP Unknown)   SpO2 96%   BMI 29.48 kg/m    Wt Readings from Last 3 Encounters:  09/16/24 156 lb (70.8 kg)  05/20/24 162 lb 4.8 oz (73.6 kg)  01/26/24 162 lb (73.5 kg)    GEN: Well nourished, well developed in no acute distress NECK: No JVD; No carotid bruits CARDIAC: RRR, no murmurs, rubs, gallops RESPIRATORY:  Clear to auscultation without rales, wheezing or rhonchi  ABDOMEN: Soft, non-tender, non-distended EXTREMITIES:  No edema; No deformity   ASSESSMENT AND PLAN: .    Palpitations Suspect that she might of had a couple episodes of PVCs.  She reports only 2 episodes over a month ago without any reoccurrences and only lasting for couple seconds.  Reports recent stress as well.  EKG here without any significant arrhythmias or PVCs noted.  We discussed the pathology of them and how they are generally benign.  Offered heart monitor but given very minor complaints she deferred.  Elevated CAC Hyperlipidemia/statin intolerance -12/2019 CAC 7.4. She has demonstrated  intolerances to just about every cholesterol lowering medication.  Her primary complaints have been spasms.  Did not tolerate Repatha either.  She tried Nexletol  and had GI related symptoms so this was stopped as well.  At this point without any significant history of cardiovascular disease or history of CVA no strong indication to be too aggressive with her cholesterol, lifestyle modifications will be most important for her. She has tried and failed pravastatin, rosuvastatin, Repatha, Zetia , Nexletol . She is a  non-smoker, does not have hypertension.  She is prediabetic.  Mitral regurgitation Mild disease noted in 2023.  Repeat echocardiogram in 3 to 5 years or sooner depending on symptoms.    Dispo: Follow-up with Dr. Lonni in February as planned.  Signed, Thom LITTIE Sluder, PA-C

## 2024-09-16 NOTE — Patient Instructions (Signed)
 Thank you for choosing [facility]!     Medication Instructions:  No medication changes were made during today's visit.  *If you need a refill on your cardiac medications before your next appointment, please call your pharmacy*   Lab Work: No labs were ordered during today's visit.  If you have labs (blood work) drawn today and your tests are completely normal, you will receive your results only by: MyChart Message (if you have MyChart) OR A paper copy in the mail If you have any lab test that is abnormal or we need to change your treatment, we will call you to review the results.   Testing/Procedures: No procedures were ordered during today's visit.     Provider:   Shelda Bruckner, MD     Follow-Up: At [facility], you and your health needs are our priority.  As part of our continuing mission to provide you with exceptional heart care, we have created designated Provider Care Teams.  These Care Teams include your primary Cardiologist (physician) and Advanced Practice Providers (APPs -  Physician Assistants and Nurse Practitioners) who all work together to provide you with the care you need, when you need it. We recommend signing up for the patient portal called MyChart.  Sign up information is provided on this After Visit Summary.  MyChart is used to connect with patients for Virtual Visits (Telemedicine).  Patients are able to view lab/test results, encounter notes, upcoming appointments, etc.  Non-urgent messages can be sent to your provider as well.   To learn more about what you can do with MyChart, go to [email or web address].

## 2024-10-27 ENCOUNTER — Telehealth (HOSPITAL_BASED_OUTPATIENT_CLINIC_OR_DEPARTMENT_OTHER): Payer: Self-pay

## 2024-10-27 NOTE — Telephone Encounter (Signed)
 Patient left a voicemail on nurses' line. She is wondering if Dr. Cleotilde could prescribe her different antidepressant medication (asked about potentially Trazadone. Elavil  does not seem to be helping her and her husband is now in hospice. She is having a hard time with this. She is seeing a therapist as well.   Call back number [phone]  Morna LOISE Quale, RN

## 2024-10-29 ENCOUNTER — Encounter (HOSPITAL_BASED_OUTPATIENT_CLINIC_OR_DEPARTMENT_OTHER): Payer: Self-pay | Admitting: Obstetrics & Gynecology

## 2024-11-01 ENCOUNTER — Encounter (HOSPITAL_BASED_OUTPATIENT_CLINIC_OR_DEPARTMENT_OTHER): Payer: Self-pay

## 2024-11-09 ENCOUNTER — Encounter (HOSPITAL_BASED_OUTPATIENT_CLINIC_OR_DEPARTMENT_OTHER): Payer: Self-pay | Admitting: Obstetrics & Gynecology

## 2024-11-22 ENCOUNTER — Ambulatory Visit (HOSPITAL_BASED_OUTPATIENT_CLINIC_OR_DEPARTMENT_OTHER): Admitting: Cardiology

## 2025-04-10 ENCOUNTER — Ambulatory Visit (HOSPITAL_BASED_OUTPATIENT_CLINIC_OR_DEPARTMENT_OTHER): Payer: Medicare Other | Admitting: Obstetrics & Gynecology
